# Patient Record
Sex: Female | Born: 2000 | Race: Black or African American | Hispanic: No | Marital: Single | Smoking: Current some day smoker
Health system: Southern US, Community
[De-identification: ages and names within clinical notes are randomized; demographics above are authoritative.]

## PROBLEM LIST (undated history)

## (undated) DIAGNOSIS — N946 Dysmenorrhea, unspecified: Secondary | ICD-10-CM

## (undated) DIAGNOSIS — Z9109 Other allergy status, other than to drugs and biological substances: Secondary | ICD-10-CM

## (undated) HISTORY — PX: TONSILLECTOMY: SUR1361

## (undated) HISTORY — PX: OTHER SURGICAL HISTORY: SHX169

## (undated) HISTORY — DX: Dysmenorrhea, unspecified: N94.6

---
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
## 2019-02-13 ENCOUNTER — Ambulatory Visit: Payer: Medicaid Other | Attending: Internal Medicine

## 2019-02-14 ENCOUNTER — Ambulatory Visit: Payer: Medicaid Other | Attending: Internal Medicine

## 2019-02-14 DIAGNOSIS — Z20822 Contact with and (suspected) exposure to covid-19: Secondary | ICD-10-CM

## 2019-02-16 LAB — NOVEL CORONAVIRUS, NAA: SARS-CoV-2, NAA: NOT DETECTED

## 2019-06-01 ENCOUNTER — Other Ambulatory Visit: Payer: Self-pay

## 2019-06-01 ENCOUNTER — Encounter (HOSPITAL_COMMUNITY): Payer: Self-pay | Admitting: Emergency Medicine

## 2019-06-01 ENCOUNTER — Emergency Department (HOSPITAL_COMMUNITY)
Admission: EM | Admit: 2019-06-01 | Discharge: 2019-06-02 | Disposition: A | Discharge status: Home / Self Care | Payer: Medicaid Other | Attending: Emergency Medicine | Admitting: Emergency Medicine

## 2019-06-01 DIAGNOSIS — Z79899 Other long term (current) drug therapy: Secondary | ICD-10-CM | POA: Insufficient documentation

## 2019-06-01 DIAGNOSIS — L02411 Cutaneous abscess of right axilla: Secondary | ICD-10-CM | POA: Diagnosis not present

## 2019-06-01 DIAGNOSIS — Z7722 Contact with and (suspected) exposure to environmental tobacco smoke (acute) (chronic): Secondary | ICD-10-CM | POA: Insufficient documentation

## 2019-06-01 DIAGNOSIS — R2231 Localized swelling, mass and lump, right upper limb: Secondary | ICD-10-CM | POA: Diagnosis present

## 2019-06-01 LAB — CBC WITH DIFFERENTIAL/PLATELET
Abs Immature Granulocytes: 0.09 10*3/uL — ABNORMAL HIGH (ref 0.00–0.07)
Basophils Absolute: 0.1 10*3/uL (ref 0.0–0.1)
Basophils Relative: 0 %
Eosinophils Absolute: 0.2 10*3/uL (ref 0.0–0.5)
Eosinophils Relative: 1 %
HCT: 39.1 % (ref 36.0–46.0)
Hemoglobin: 11.8 g/dL — ABNORMAL LOW (ref 12.0–15.0)
Immature Granulocytes: 1 %
Lymphocytes Relative: 22 %
Lymphs Abs: 3.6 10*3/uL (ref 0.7–4.0)
MCH: 21.7 pg — ABNORMAL LOW (ref 26.0–34.0)
MCHC: 30.2 g/dL (ref 30.0–36.0)
MCV: 71.9 fL — ABNORMAL LOW (ref 80.0–100.0)
Monocytes Absolute: 0.9 10*3/uL (ref 0.1–1.0)
Monocytes Relative: 5 %
Neutro Abs: 11.6 10*3/uL — ABNORMAL HIGH (ref 1.7–7.7)
Neutrophils Relative %: 71 %
Platelets: 525 10*3/uL — ABNORMAL HIGH (ref 150–400)
RBC: 5.44 MIL/uL — ABNORMAL HIGH (ref 3.87–5.11)
RDW: 14.4 % (ref 11.5–15.5)
WBC: 16.5 10*3/uL — ABNORMAL HIGH (ref 4.0–10.5)
nRBC: 0 % (ref 0.0–0.2)

## 2019-06-01 LAB — I-STAT BETA HCG BLOOD, ED (MC, WL, AP ONLY): I-stat hCG, quantitative: 30.2 m[IU]/mL — ABNORMAL HIGH (ref <5)

## 2019-06-01 NOTE — ED Triage Notes (Signed)
Patient reports worsening multiple abscesses at right axilla for several days , no fever or drainage .

## 2019-06-02 LAB — BASIC METABOLIC PANEL
Anion gap: 10 (ref 5–15)
BUN: 8 mg/dL (ref 6–20)
CO2: 25 mmol/L (ref 22–32)
Calcium: 9 mg/dL (ref 8.9–10.3)
Chloride: 102 mmol/L (ref 98–111)
Creatinine, Ser: 0.73 mg/dL (ref 0.44–1.00)
GFR calc Af Amer: >60 mL/min (ref >60)
GFR calc non Af Amer: >60 mL/min (ref >60)
Glucose, Bld: 96 mg/dL (ref 70–99)
Potassium: 3.3 mmol/L — ABNORMAL LOW (ref 3.5–5.1)
Sodium: 137 mmol/L (ref 135–145)

## 2019-06-02 MED ORDER — DOXYCYCLINE HYCLATE 100 MG PO CAPS: 100.0000 mg | ORAL_CAPSULE | Freq: Two times a day (BID) | ORAL | 0 refills | Status: AC

## 2019-06-02 MED ORDER — LIDOCAINE-EPINEPHRINE (PF) 2 %-1:200000 IJ SOLN
10.0000 mL | Freq: Once | INTRAMUSCULAR | Status: AC
  Administered 2019-06-02: 10 mL via INTRADERMAL
  Filled 2019-06-02: qty 20

## 2019-06-02 MED ORDER — ACETAMINOPHEN 325 MG PO TABS
650.0000 mg | ORAL_TABLET | Freq: Once | ORAL | Status: AC
  Administered 2019-06-02: 650 mg via ORAL
  Filled 2019-06-02: qty 2

## 2019-06-02 NOTE — Discharge Instructions (Addendum)
I have prescribed a short course of antibiotics to help treat your infection, please take 1 tablet twice a day for the next 7 days.  A referral to [facility] is attached to your chart if you choose to have further management of these recurrent abscesses.

## 2019-06-02 NOTE — ED Provider Notes (Signed)
[facility] EMERGENCY DEPARTMENT Provider Note   CSN: [id number] Arrival date & time: 06/01/19  2154     History Chief Complaint  Patient presents with  . Abscess    Right Axilla    Theresa Mcgrath is a 19 y.o. female.  19 y.o female with a PMH of HS presents to the ED with a chief complaint of right axilla abscess.LMP approximately 2 weeks ago.   The history is provided by the patient.  Abscess Location:  Shoulder/arm Shoulder/arm abscess location:  R axilla Abscess quality: draining, painful and warmth   Red streaking: no   Duration:  1 week Progression:  Worsening Pain details:    Quality:  Pressure   Severity:  Moderate   Duration:  1 week   Timing:  Constant Chronicity:  Recurrent Context: not diabetes, not immunosuppression and not injected drug use   Relieved by:  Nothing Worsened by:  Draining/squeezing Ineffective treatments:  None tried Associated symptoms: fever   Risk factors: prior abscess   Risk factors: no family hx of MRSA and no hx of MRSA        Past Medical History:  Diagnosis Date  . Dysmenorrhea   . Environmental allergies     Patient Active Problem List   Diagnosis Date Noted  . Morbid obesity (HCC) 03/18/2012    Past Surgical History:  Procedure Laterality Date  . TONSILLECTOMY       OB History   No obstetric history on file.     Family History  Problem Relation Age of Onset  . Hypertension Paternal Grandmother   . Hypertension Paternal Grandfather     Social History   Tobacco Use  . Smoking status: Passive Smoke Exposure - Never Smoker  . Smokeless tobacco: Never Used  Substance Use Topics  . Alcohol use: No  . Drug use: No    Home Medications Prior to Admission medications   Medication Sig Start Date End Date Taking? Authorizing Provider  doxycycline (MONODOX) 100 MG capsule Take 100 mg by mouth 2 (two) times daily.   Yes [provider]  doxycycline (VIBRAMYCIN) 100 MG capsule Take 1  capsule (100 mg total) by mouth 2 (two) times daily for 7 days. 06/02/19 06/09/19  Claude Manges, PA-C    Allergies    Patient has no known allergies.  Review of Systems   Review of Systems  Constitutional: Positive for fever.  Skin: Positive for wound.    Physical Exam Updated Vital Signs BP 126/78   Pulse (!) 108   Temp 98.7 F (37.1 C) (Oral)   Resp 18   Ht 5\' 7"  (1.702 m)   Wt 130 kg   LMP 05/18/2019   SpO2 100%   BMI 44.89 kg/m   Physical Exam Vitals and nursing note reviewed.  Constitutional:      Appearance: Normal appearance. She is not ill-appearing.  HENT:     Head: Normocephalic and atraumatic.     Mouth/Throat:     Mouth: Mucous membranes are moist.  Cardiovascular:     Rate and Rhythm: Tachycardia present.     Comments: Febrile. Pulmonary:     Effort: Pulmonary effort is normal.  Abdominal:     General: Abdomen is flat.  Musculoskeletal:     Cervical back: Normal range of motion and neck supple.  Skin:    General: Skin is warm and dry.     Findings: Abscess and erythema present.     Comments: 1 cm indurated, fluctuant, actively  draining abscess under right axilla.  Neurological:     Mental Status: She is alert and oriented to person, place, and time.     ED Results / Procedures / Treatments   Labs (all labs ordered are listed, but only abnormal results are displayed) Labs Reviewed  CBC WITH DIFFERENTIAL/PLATELET - Abnormal; Notable for the following components:      Result Value   WBC 16.5 (*)    RBC 5.44 (*)    Hemoglobin 11.8 (*)    MCV 71.9 (*)    MCH 21.7 (*)    Platelets 525 (*)    Neutro Abs 11.6 (*)    Abs Immature Granulocytes 0.09 (*)    All other components within normal limits  BASIC METABOLIC PANEL - Abnormal; Notable for the following components:   Potassium 3.3 (*)    All other components within normal limits  I-STAT BETA HCG BLOOD, ED ([facility], [facility], [facility] ONLY) - Abnormal; Notable for the following components:   I-stat hCG,  quantitative 30.2 (*)    All other components within normal limits    EKG None  Radiology No results found.  Procedures .Marland KitchenIncision and Drainage  Date/Time: 06/02/2019 8:03 AM Performed by: Claude Manges, PA-C Authorized by: Claude Manges, PA-C   Consent:    Consent obtained:  Verbal   Consent given by:  Patient   Risks discussed:  Bleeding, incomplete drainage, pain and damage to other organs   Alternatives discussed:  No treatment Universal protocol:    Procedure explained and questions answered to patient or proxy's satisfaction: yes     Relevant documents present and verified: yes     Test results available and properly labeled: yes     Imaging studies available: yes     Required blood products, implants, devices, and special equipment available: yes     Site/side marked: yes     Immediately prior to procedure a time out was called: yes     Patient identity confirmed:  Verbally with patient Location:    Type:  Abscess   Size:  1cm   Location:  Upper extremity   Upper extremity location:  Arm   Arm location:  R upper arm Pre-procedure details:    Skin preparation:  Betadine Anesthesia (see MAR for exact dosages):    Anesthesia method:  None Procedure type:    Complexity:  Simple Procedure details:    Incision types:  Single straight   Incision depth:  Subcutaneous   Wound management:  Probed and deloculated, irrigated with saline and extensive cleaning   Drainage:  Purulent and bloody   Drainage amount:  Moderate   Packing materials:  None Post-procedure details:    Patient tolerance of procedure:  Tolerated well, no immediate complications Comments:     Abscess was deloculated without stab incision per patient's request.    (including critical care time)  Medications Ordered in ED Medications  acetaminophen (TYLENOL) tablet 650 mg (650 mg Oral Given 06/02/19 0701)  lidocaine-EPINEPHrine (XYLOCAINE W/EPI) 2 %-1:200000 (PF) injection 10 mL (10 mLs Intradermal  Given 06/02/19 5701)    ED Course  I have reviewed the triage vital signs and the nursing notes.  Pertinent labs & imaging results that were available during my care of the patient were reviewed by me and considered in my medical decision making (see chart for details).  Clinical Course as of Jun 02 854  Fri Jun 02, 2019  0740 LMP 2 weeks ago.   I-stat hCG, quantitative(!): 30.2 [JS]  Clinical Course User Index [JS] Janeece Fitting, PA-C   MDM Rules/Calculators/A&P     Patient with a recurring history of at bedtime presents to the ED with right axilla abscess for the past week.  Has also been running fevers at home,  In the ED with a T-max of 101.2, tachycardia present.  Abscess is actively draining with patient is in the room, discussed I&D in order to further the loculated abscess.  She is agreeable of treatment.  According to her chart which I extensively review, she has had this I indeed in the past.  Interpretation of her labs by me revealed a CBC remarkable for a leukocytosis of 16.5, BMP with some mild hypokalemia, normal creatinine function.  Beta hCG is 30, however her last menstrual period was approximately 2 weeks ago.  She has no other complaints.  I have personally do loculated abscess, extensive amount of purulent discharge from it, irrigated and cleaned along with dressed.  No stab incision was made per patient's request as abscess was actively draining at the time of procedure.  Vitals were rechecked after receiving Tylenol for fever.  She will go home on a short course of doxy to help with symptomatic treatment.  8:56 AM BP 126/78   Pulse (!) 108   Temp 98.7 F (37.1 C) (Oral)   Resp 18   Ht 5\' 7"  (1.702 m)   Wt 130 kg   LMP 05/18/2019   SpO2 100%   BMI 44.89 kg/m   Patient is stable for discharge.  Portions of this note were generated with [employer]. Dictation errors may occur despite best attempts at proofreading.  Final Clinical  Impression(s) / ED Diagnoses Final diagnoses:  Abscess of axilla, right    Rx / DC Orders ED Discharge Orders         Ordered    doxycycline (VIBRAMYCIN) 100 MG capsule  2 times daily     06/02/19 0806           Janeece Fitting, PA-C 06/02/19 0857    Orpah Greek, MD 06/03/19 [phone]

## 2020-08-27 ENCOUNTER — Encounter (HOSPITAL_COMMUNITY): Payer: Self-pay

## 2020-08-27 ENCOUNTER — Ambulatory Visit (HOSPITAL_COMMUNITY)
Admission: EM | Admit: 2020-08-27 | Discharge: 2020-08-27 | Disposition: A | Discharge status: Home / Self Care | Payer: Medicaid Other | Attending: Physician Assistant | Admitting: Physician Assistant

## 2020-08-27 DIAGNOSIS — L02411 Cutaneous abscess of right axilla: Secondary | ICD-10-CM

## 2020-08-27 DIAGNOSIS — L03111 Cellulitis of right axilla: Secondary | ICD-10-CM | POA: Diagnosis not present

## 2020-08-27 MED ORDER — DOXYCYCLINE HYCLATE 100 MG PO CAPS: 100.0000 mg | ORAL_CAPSULE | Freq: Two times a day (BID) | ORAL | 0 refills | Status: DC

## 2020-08-27 MED ORDER — LIDOCAINE-EPINEPHRINE 1 %-1:100000 IJ SOLN
INTRAMUSCULAR | Status: AC
  Filled 2020-08-27: qty 1

## 2020-08-27 NOTE — ED Provider Notes (Signed)
[facility]    CSN: [id number] Arrival date & time: 08/27/20  9563      History   Chief Complaint Chief Complaint  Patient presents with   Abscess    HPI Theresa Mcgrath is a 20 y.o. female.   Patient presents today with a several day history of enlarging abscess in her right axilla.  She has a history of hidradenitis suppurativa and often gets recurrent abscesses.  Reports she has had to have them drained in the past and believes this will need to be drained as he continues to become larger and more painful.  Reports pain is rated 10 on a 0-10 pain scale, localized to affected area, described as throbbing, worse with palpation, no relieving factors identified.  She denies any recent antibiotic use.  She has no concern for pregnancy.  She denies any fever, nausea, vomiting, body aches.  She is not currently followed by dermatology.   Past Medical History:  Diagnosis Date   Dysmenorrhea    Environmental allergies     Patient Active Problem List   Diagnosis Date Noted   Morbid obesity (HCC) 03/18/2012    Past Surgical History:  Procedure Laterality Date   TONSILLECTOMY      OB History   No obstetric history on file.      Home Medications    Prior to Admission medications   Medication Sig Start Date End Date Taking? Authorizing Provider  doxycycline (VIBRAMYCIN) 100 MG capsule Take 1 capsule (100 mg total) by mouth 2 (two) times daily. 08/27/20  Yes Draven Natter, Noberto Retort, PA-C    Family History Family History  Problem Relation Age of Onset   Hypertension Paternal Grandmother    Hypertension Paternal Grandfather     Social History Social History   Tobacco Use   Smoking status: Passive Smoke Exposure - Never Smoker   Smokeless tobacco: Never  Substance Use Topics   Alcohol use: No   Drug use: No     Allergies   Patient has no known allergies.   Review of Systems Review of Systems  Constitutional:  Positive for activity change. Negative for appetite  change, fatigue and fever.  Respiratory:  Negative for cough and shortness of breath.   Gastrointestinal:  Negative for abdominal pain, diarrhea, nausea and vomiting.  Musculoskeletal:  Negative for arthralgias and myalgias.  Skin:  Positive for color change and wound.  Neurological:  Negative for dizziness, light-headedness and headaches.    Physical Exam Triage Vital Signs ED Triage Vitals  Enc Vitals Group     BP 08/27/20 1944 137/73     Pulse Rate 08/27/20 1944 (!) 124     Resp 08/27/20 1944 20     Temp 08/27/20 1944 99.9 F (37.7 C)     Temp Source 08/27/20 1944 Oral     SpO2 08/27/20 1944 100 %     Weight --      Height --      Head Circumference --      Peak Flow --      Pain Score 08/27/20 1951 5     Pain Loc --      Pain Edu? --      Excl. in GC? --    No data found.  Updated Vital Signs BP (!) 146/84 (BP Location: Right Arm)   Pulse (!) 106   Temp 99.9 F (37.7 C) (Oral)   Resp 20   SpO2 100%   Visual Acuity Right Eye Distance:  Left Eye Distance:   Bilateral Distance:    Right Eye Near:   Left Eye Near:    Bilateral Near:     Physical Exam Vitals reviewed.  Constitutional:      General: She is awake. She is not in acute distress.    Appearance: Normal appearance. She is not ill-appearing.     Comments: Very pleasant female appears stated age in no acute distress sitting comfortably on exam room table  HENT:     Head: Normocephalic and atraumatic.  Cardiovascular:     Rate and Rhythm: Normal rate and regular rhythm.     Heart sounds: Normal heart sounds, S1 normal and S2 normal. No murmur heard. Pulmonary:     Effort: Pulmonary effort is normal.     Breath sounds: Normal breath sounds. No wheezing, rhonchi or rales.     Comments: Clear to auscultation bilaterally Abdominal:     Palpations: Abdomen is soft.     Tenderness: There is no abdominal tenderness.  Skin:    Findings: Abscess present.     Comments: 7 cm x 5 cm abscess with central  fluctuance noted right axilla.  No streaking or evidence of lymphangitis.  Area is tender to palpation and warm to touch.  No active bleeding or drainage.  Psychiatric:        Behavior: Behavior is cooperative.       UC Treatments / Results  Labs (all labs ordered are listed, but only abnormal results are displayed) Labs Reviewed - No data to display  EKG   Radiology No results found.  Procedures Incision and Drainage  Date/Time: 08/27/2020 8:27 PM Performed by: Jeani Hawking, PA-C Authorized by: Jeani Hawking, PA-C   Consent:    Consent obtained:  Verbal   Consent given by:  Patient   Risks, benefits, and alternatives were discussed: yes     Risks discussed:  Bleeding, incomplete drainage, infection and pain   Alternatives discussed:  Delayed treatment, alternative treatment, observation and referral Universal protocol:    Procedure explained and questions answered to patient or proxy's satisfaction: yes     Patient identity confirmed:  Verbally with patient Location:    Type:  Abscess   Size:  7 cm x 5 cm   Location:  Upper extremity   Upper extremity location:  Shoulder   Shoulder location:  R shoulder Pre-procedure details:    Skin preparation:  Chlorhexidine Sedation:    Sedation type:  None Anesthesia:    Anesthesia method:  Local infiltration   Local anesthetic:  Lidocaine 2% WITH epi Procedure type:    Complexity:  Simple Procedure details:    Ultrasound guidance: no     Needle aspiration: no     Incision types:  Single straight   Incision depth:  Dermal   Wound management:  Probed and deloculated and irrigated with saline   Drainage:  Bloody and purulent   Drainage amount:  Copious   Wound treatment:  Wound left open   Packing materials:  None Post-procedure details:    Procedure completion:  Tolerated well, no immediate complications (including critical care time)  Medications Ordered in UC Medications - No data to display  Initial  Impression / Assessment and Plan / UC Course  I have reviewed the triage vital signs and the nursing notes.  Pertinent labs & imaging results that were available during my care of the patient were reviewed by me and considered in my medical decision making (see chart for  details).      I&D performed in clinic with significant improvement of pain.  See procedure note above.  Patient started on doxycycline 100 mg twice daily for 10 days with instruction to avoid prolonged sun exposure due to positivity associate with this medication.  Discussed alarm symptoms that warrant emergent evaluation.  She can use over-the-counter analgesics for pain relief.  Strict return precautions given.  Patient expressed understanding.  Discussed potential need to see dermatology as symptoms will likely continue to recur unless HS is treated.  Final Clinical Impressions(s) / UC Diagnoses   Final diagnoses:  Abscess of axilla, right  Cellulitis of right axilla     Discharge Instructions      Do doxycycline 100 mg twice daily for 10 days.  This can make you sensitive to the sun so do not be in the sun for long periods of time on this medication.  Keep area clean.  It is importantly follow-up with dermatology as symptoms will likely recur.  If you have any worsening symptoms including recurrence of pain, redness, swelling, fever you need to be seen immediately.  Please follow-up with your primary care provider in 1 to 2 days to ensure symptom improvement.  If you are unable to get in with them in this timeframe please come back to see [place].     ED Prescriptions     Medication Sig Dispense Auth. Provider   doxycycline (VIBRAMYCIN) 100 MG capsule Take 1 capsule (100 mg total) by mouth 2 (two) times daily. 20 capsule Anjelo Pullman, Noberto Retort, PA-C      PDMP not reviewed this encounter.   Jeani Hawking, PA-C 08/27/20 2029

## 2020-08-27 NOTE — ED Triage Notes (Signed)
Pt presents with abscess under right arm X 3 days.

## 2020-08-27 NOTE — Discharge Instructions (Addendum)
Do doxycycline 100 mg twice daily for 10 days.  This can make you sensitive to the sun so do not be in the sun for long periods of time on this medication.  Keep area clean.  It is importantly follow-up with dermatology as symptoms will likely recur.  If you have any worsening symptoms including recurrence of pain, redness, swelling, fever you need to be seen immediately.  Please follow-up with your primary care provider in 1 to 2 days to ensure symptom improvement.  If you are unable to get in with them in this timeframe please come back to see [place].

## 2021-03-20 ENCOUNTER — Telehealth: Payer: Medicaid Other | Admitting: Physician Assistant

## 2021-03-20 DIAGNOSIS — H1033 Unspecified acute conjunctivitis, bilateral: Secondary | ICD-10-CM | POA: Diagnosis not present

## 2021-03-21 MED ORDER — POLYMYXIN B-TRIMETHOPRIM 10000-0.1 UNIT/ML-% OP SOLN: OPHTHALMIC | 0 refills | Status: DC

## 2021-03-21 NOTE — Progress Notes (Signed)
I have spent 5 minutes in review of e-visit questionnaire, review and updating patient chart, medical decision making and response to patient.   Sloka Volante Cody Musab Wingard, PA-C    

## 2021-03-21 NOTE — Progress Notes (Signed)

## 2021-03-21 NOTE — Progress Notes (Signed)
Message sent to patient requesting further input regarding current symptoms. Awaiting patient response.  

## 2021-04-15 ENCOUNTER — Telehealth: Payer: Medicaid Other | Admitting: Family Medicine

## 2021-04-15 ENCOUNTER — Encounter: Payer: Self-pay | Admitting: Family Medicine

## 2021-04-15 DIAGNOSIS — H10022 Other mucopurulent conjunctivitis, left eye: Secondary | ICD-10-CM | POA: Diagnosis not present

## 2021-04-15 MED ORDER — POLYMYXIN B-TRIMETHOPRIM 10000-0.1 UNIT/ML-% OP SOLN: OPHTHALMIC | 0 refills | Status: DC

## 2021-04-15 NOTE — Progress Notes (Signed)

## 2021-08-21 ENCOUNTER — Telehealth: Payer: Medicaid Other | Admitting: Physician Assistant

## 2021-08-21 DIAGNOSIS — L732 Hidradenitis suppurativa: Secondary | ICD-10-CM

## 2021-08-21 MED ORDER — DOXYCYCLINE HYCLATE 100 MG PO TABS: 100.0000 mg | ORAL_TABLET | Freq: Two times a day (BID) | ORAL | 0 refills | Status: AC

## 2021-08-21 NOTE — Progress Notes (Signed)
E Visit for Cellulitis  We are sorry that you are not feeling well. Here is how we plan to help!  Based on what you shared with me it looks like you have chronic HS. I am happy to give a short course of Doxycycline to calm down inflammation and any current infection in the areas, but for ongoing management you will need to see a primary care provider so that they can work with you ongoing to get this better managed which can include Dermatology referral as well. If you have a PCP, please give them a call to schedule a follow-up. If not, you can use the link below to help get scheduled with a new primary provider.    I have prescribed:  Doxycycline 100 mg twice daily for 10 days.    MAKE SURE YOU   Understand these instructions. Will watch your condition. Will get help right away if you are not doing well or get worse.  Thank you for choosing an e-visit.  Your e-visit answers were reviewed by a board certified advanced clinical practitioner to complete your personal care plan. Depending upon the condition, your plan could have included both over the counter or prescription medications.  Please review your pharmacy choice. Make sure the pharmacy is open so you can pick up prescription now. If there is a problem, you may contact your provider through [employer] and have the prescription routed to another pharmacy.  Your safety is important to [place]. If you have drug allergies check your prescription carefully.   For the next 24 hours you can use MyChart to ask questions about today's visit, request a non-urgent call back, or ask for a work or school excuse. You will get an email in the next two days asking about your experience. I hope that your e-visit has been valuable and will speed your recovery.

## 2021-08-27 NOTE — Progress Notes (Signed)
I have spent 5 minutes in review of e-visit questionnaire, review and updating patient chart, medical decision making and response to patient.   Shamiya Demeritt Cody Diquan Kassis, PA-C    

## 2021-10-06 ENCOUNTER — Telehealth: Payer: Medicaid Other | Admitting: Physician Assistant

## 2021-10-06 DIAGNOSIS — K529 Noninfective gastroenteritis and colitis, unspecified: Secondary | ICD-10-CM

## 2021-10-06 NOTE — Progress Notes (Signed)
I have spent 5 minutes in review of e-visit questionnaire, review and updating patient chart, medical decision making and response to patient.   Gladie Gravette Cody Ezriel Boffa, PA-C    

## 2021-10-06 NOTE — Progress Notes (Signed)
We are sorry that you are not feeling well.  Here is how we plan to help!  Based on what you have shared with me it looks like you have Acute Infectious Diarrhea.  Most cases of acute diarrhea are due to infections with virus and bacteria and are self-limited conditions lasting less than 14 days.  For your symptoms you may take Imodium 2 mg tablets that are over the counter at your local pharmacy. Take two tablet now and then one after each loose stool up to 6 a day.  Antibiotics are not needed for most people with diarrhea.  I have prescribed Zofran 4 mg 1 tablet every 8 hours as needed for nausea and vomiting  I will send a work note to your MyChart shortly that will cover today and to include tomorrow if needed. We are not allowed to retroactively date notes unfortunately.   HOME CARE We recommend changing your diet to help with your symptoms for the next few days. Drink plenty of fluids that contain water salt and sugar. Sports drinks such as Gatorade may help.  You may try broths, soups, bananas, applesauce, soft breads, mashed potatoes or crackers.  You are considered infectious for as long as the diarrhea continues. Hand washing or use of alcohol based hand sanitizers is recommend. It is best to stay out of work or school until your symptoms stop.   GET HELP RIGHT AWAY If you have dark yellow colored urine or do not pass urine frequently you should drink more fluids.   If your symptoms worsen  If you feel like you are going to pass out (faint) You have a new problem  MAKE SURE YOU  Understand these instructions. Will watch your condition. Will get help right away if you are not doing well or get worse.  Thank you for choosing an e-visit.  Your e-visit answers were reviewed by a board certified advanced clinical practitioner to complete your personal care plan. Depending upon the condition, your plan could have included both over the counter or prescription  medications.  Please review your pharmacy choice. Make sure the pharmacy is open so you can pick up prescription now. If there is a problem, you may contact your provider through [employer] and have the prescription routed to another pharmacy.  Your safety is important to [place]. If you have drug allergies check your prescription carefully.   For the next 24 hours you can use MyChart to ask questions about today's visit, request a non-urgent call back, or ask for a work or school excuse. You will get an email in the next two days asking about your experience. I hope that your e-visit has been valuable and will speed your recovery.

## 2021-11-07 ENCOUNTER — Telehealth: Payer: Medicaid Other | Admitting: Emergency Medicine

## 2021-11-07 DIAGNOSIS — J069 Acute upper respiratory infection, unspecified: Secondary | ICD-10-CM

## 2021-11-07 MED ORDER — BENZONATATE 100 MG PO CAPS: 100.0000 mg | ORAL_CAPSULE | Freq: Two times a day (BID) | ORAL | 0 refills | Status: DC | PRN

## 2021-11-07 MED ORDER — AZELASTINE HCL 0.1 % NA SOLN: 2.0000 | Freq: Two times a day (BID) | NASAL | 0 refills | Status: DC

## 2021-11-07 NOTE — Progress Notes (Signed)
E-Visit for Upper Respiratory Infection   We are sorry you are not feeling well.  Here is how we plan to help!  Based on what you have shared with me, it looks like you may have a viral upper respiratory infection.  Upper respiratory infections are caused by a large number of viruses; however, rhinovirus is the most common cause.   It is not likely that you have strep throat. If you are worried that you might have strep, you should get tested at a clinic such as an urgent care.    Symptoms of an upper respiratory infection vary from person to person, with common symptoms including sore throat, cough, fatigue or lack of energy and feeling of general discomfort.  A low-grade fever of up to 100.4 may present, but is often uncommon.  Symptoms vary however, and are closely related to a person's age or underlying illnesses.  The most common symptoms associated with an upper respiratory infection are nasal discharge or congestion, cough, sneezing, headache and pressure in the ears and face.  These symptoms usually persist for about 3 to 10 days, but can last up to 2 weeks.  It is important to know that upper respiratory infections do not cause serious illness or complications in most cases.    Upper respiratory infections can be transmitted from person to person, with the most common method of transmission being a person's hands.  The virus is able to live on the skin and can infect other persons for up to 2 hours after direct contact.  Also, these can be transmitted when someone coughs or sneezes; thus, it is important to cover the mouth to reduce this risk.  To keep the spread of the illness at [place], good hand hygiene is very important.  This is an infection that is most likely caused by a virus. There are no specific treatments other than to help you with the symptoms until the infection runs its course.  We are sorry you are not feeling well.  Here is how we plan to help!   For nasal congestion, you may  use an oral decongestants such as Mucinex D or if you have glaucoma or high blood pressure use plain Mucinex.    Saline nasal spray or nasal drops can help and can safely be used as often as needed for congestion.  Try using saline irrigation, such as with a neti pot, several times a day while you are sick. Many neti pots come with salt packets premeasured to use to make saline. If you use your own salt, make sure it is kosher salt or sea salt (don't use table salt as it has iodine in it and you don't need that in your nose). Use distilled water to make saline. If you mix your own saline using your own salt, the recipe is 1/4 teaspoon salt in 1 cup warm water. Using saline irrigation can help prevent and treat sinus infections.   For your congestion, I have prescribed Azelastine nasal spray two sprays in each nostril twice a day  If you do not have a history of heart disease, hypertension, diabetes or thyroid disease, prostate/bladder issues or glaucoma, you may also use Sudafed to treat nasal congestion.  It is highly recommended that you consult with a pharmacist or your primary care physician to ensure this medication is safe for you to take.     If you have a cough, you may use cough suppressants such as Delsym and Robitussin.  If  you have glaucoma or high blood pressure, you can also use Coricidin HBP.   For cough I have prescribed for you A prescription cough medication called Tessalon Perles 100 mg. You may take 1-2 capsules every 8 hours as needed for cough  If you have a sore or scratchy throat, use a saltwater gargle-  to  teaspoon of salt dissolved in a 4-ounce to 8-ounce glass of warm water.  Gargle the solution for approximately 15-30 seconds and then spit.  It is important not to swallow the solution.  You can also use throat lozenges/cough drops and Chloraseptic spray to help with throat pain or discomfort.  Warm or cold liquids can also be helpful in relieving throat pain.  Hot herbal  tea, such as chamomile, with honey and lemon juice also can help soothe a sore throat like yours. The lemon juice in particular helps the pain.   For headache, pain or general discomfort, you can use Ibuprofen or Tylenol as directed.   Some authorities believe that zinc sprays or the use of Echinacea may shorten the course of your symptoms.   HOME CARE Only take medications as instructed by your medical team. Be sure to drink plenty of fluids. Water is fine as well as fruit juices, sodas and electrolyte beverages. You may want to stay away from caffeine or alcohol. If you are nauseated, try taking small sips of liquids. How do you know if you are getting enough fluid? Your urine should be a pale yellow or almost colorless. Get rest. Taking a steamy shower or using a humidifier may help nasal congestion and ease sore throat pain. You can place a towel over your head and breathe in the steam from hot water coming from a faucet. Using a saline nasal spray works much the same way. Cough drops, hard candies and sore throat lozenges may ease your cough. Avoid close contacts especially the very young and the elderly Cover your mouth if you cough or sneeze Always remember to wash your hands.   GET HELP RIGHT AWAY IF: You develop worsening fever. If your symptoms do not improve within 10 days You develop yellow or green discharge from your nose over 3 days. You have coughing fits You develop a severe head ache or visual changes. You develop shortness of breath, difficulty breathing or start having chest pain Your symptoms persist after you have completed your treatment plan  MAKE SURE YOU  Understand these instructions. Will watch your condition. Will get help right away if you are not doing well or get worse.  Thank you for choosing an e-visit.  Your e-visit answers were reviewed by a board certified advanced clinical practitioner to complete your personal care plan. Depending upon the  condition, your plan could have included both over the counter or prescription medications.  Please review your pharmacy choice. Make sure the pharmacy is open so you can pick up prescription now. If there is a problem, you may contact your provider through [employer] and have the prescription routed to another pharmacy.  Your safety is important to [place]. If you have drug allergies check your prescription carefully.   For the next 24 hours you can use MyChart to ask questions about today's visit, request a non-urgent call back, or ask for a work or school excuse. You will get an email in the next two days asking about your experience. I hope that your e-visit has been valuable and will speed your recovery.   I have spent  5 minutes in review of e-visit questionnaire, review and updating patient chart, medical decision making and response to patient.   Willeen Cass, PhD, FNP-BC

## 2021-11-10 ENCOUNTER — Telehealth: Payer: Medicaid Other | Admitting: Physician Assistant

## 2021-11-10 ENCOUNTER — Telehealth: Payer: Medicaid Other | Admitting: Family Medicine

## 2021-11-10 DIAGNOSIS — U071 COVID-19: Secondary | ICD-10-CM

## 2021-11-10 DIAGNOSIS — R0602 Shortness of breath: Secondary | ICD-10-CM

## 2021-11-10 NOTE — Progress Notes (Signed)
Because you are COVID + and noting frequent shortness of breath, you need to be evaluated in person for examination and assessment of oxygen levels.   NOTE: There will be NO CHARGE for this eVisit   If you are having a true medical emergency please call 911.      For an urgent face to face visit, [facility] has seven urgent care centers for your convenience:     [facility] [place] Get Driving Directions [phone] [place], [place] [address]    [facility]) Get Driving Directions [phone] [place], [place] [address]  [facility] [place] [facility]) Get Driving Directions [phone] [address] [place],  [place]  [address]  [facility] at [address] Get Driving Directions  [phone],  [place] [address]   [facility] Get Driving Directions [phone] [address], [place], [place] [address]   [facility] Get Driving Directions  [phone] [address] [place], [place] [address]   [facility] [place] Get Driving Directions [phone] [address] [place], [place] [address]  Your MyChart E-visit questionnaire answers were reviewed by a board certified advanced clinical practitioner to complete your personal care plan based on your specific symptoms.  Thank you for using e-Visits.

## 2021-11-10 NOTE — Progress Notes (Signed)
[place]   Needs to be seen in person to have O2 levels assessed as she is having shortness of breath on exertion, not her normal. Is + for COVID.  Patient acknowledged agreement and understanding of the plan.

## 2021-11-10 NOTE — Progress Notes (Signed)
Duplicate evisit for same complaint after patient informed she needed an in-person evaluation. Has now changed symptoms, removing frequent shortness of breath.

## 2022-08-20 ENCOUNTER — Encounter (HOSPITAL_COMMUNITY): Payer: Self-pay

## 2022-08-20 ENCOUNTER — Ambulatory Visit (HOSPITAL_COMMUNITY)
Admission: EM | Admit: 2022-08-20 | Discharge: 2022-08-20 | Disposition: A | Discharge status: Home / Self Care | Payer: Medicaid Other | Attending: Emergency Medicine | Admitting: Emergency Medicine

## 2022-08-20 DIAGNOSIS — R509 Fever, unspecified: Secondary | ICD-10-CM

## 2022-08-20 DIAGNOSIS — J029 Acute pharyngitis, unspecified: Secondary | ICD-10-CM | POA: Diagnosis present

## 2022-08-20 LAB — POCT RAPID STREP A (OFFICE): Rapid Strep A Screen: NEGATIVE

## 2022-08-20 MED ORDER — IBUPROFEN 800 MG PO TABS: 800.0000 mg | ORAL_TABLET | Freq: Three times a day (TID) | ORAL | 0 refills | Status: DC

## 2022-08-20 NOTE — ED Triage Notes (Signed)
Patient c/o sore throat and fever x 2 days.  Patient states she has taken Tylenol and cough drops. Patient did not have any Tylenol today.

## 2022-08-20 NOTE — Discharge Instructions (Signed)
You likely have a virus which is treated with symptomatic care Ibuprofen alternated with tylenol every 4-6 hours. Make sure you are drinking lots of fluids! Salt water gargles, lozenges, or throat spray  We will call you if anything returns on your throat culture (2-3 days).

## 2022-08-20 NOTE — ED Provider Notes (Signed)
[facility]    CSN: [id number] Arrival date & time: 08/20/22  1710      History   Chief Complaint Chief Complaint  Patient presents with   Fever    HPI Theresa Mcgrath is a 22 y.o. female.  Sore throat x 2 days, currently rated 9/10 Fever yesterday, tmax 102. Took tylenol No meds taken today. No fever today either.  No cough, rash, abd pain, NVD No sick contacts   Hx tonsillectomy   Past Medical History:  Diagnosis Date   Dysmenorrhea    Environmental allergies     Patient Active Problem List   Diagnosis Date Noted   Morbid obesity (HCC) 03/18/2012    Past Surgical History:  Procedure Laterality Date   TONSILLECTOMY     wisdom tooth surgery      OB History   No obstetric history on file.      Home Medications    Prior to Admission medications   Medication Sig Start Date End Date Taking? Authorizing Provider  ibuprofen (ADVIL) 800 MG tablet Take 1 tablet (800 mg total) by mouth 3 (three) times daily. 08/20/22  Yes Erykah Lippert, Lurena Joiner, PA-C    Family History Family History  Problem Relation Age of Onset   Healthy Mother    Healthy Father    Hypertension Paternal Grandmother    Hypertension Paternal Grandfather     Social History Social History   Tobacco Use   Smoking status: Passive Smoke Exposure - Never Smoker   Smokeless tobacco: Never  Vaping Use   Vaping status: Never Used  Substance Use Topics   Alcohol use: No   Drug use: No     Allergies   Latex   Review of Systems Review of Systems Per HPI  Physical Exam Triage Vital Signs ED Triage Vitals  Encounter Vitals Group     BP 08/20/22 1842 129/83     Systolic BP Percentile --      Diastolic BP Percentile --      Pulse Rate 08/20/22 1842 91     Resp 08/20/22 1842 16     Temp 08/20/22 1842 99.5 F (37.5 C)     Temp Source 08/20/22 1842 Oral     SpO2 08/20/22 1842 98 %     Weight --      Height --      Head Circumference --      Peak Flow --      Pain Score  08/20/22 1844 9     Pain Loc --      Pain Education --      Exclude from Growth Chart --    No data found.  Updated Vital Signs BP 129/83 (BP Location: Left Arm)   Pulse 91   Temp 99.5 F (37.5 C) (Oral)   Resp 16   LMP 07/23/2022   SpO2 98%    Physical Exam Vitals and nursing note reviewed.  Constitutional:      General: She is not in acute distress.    Appearance: She is not ill-appearing.     Comments: Normal phonation, tolerating secretions   HENT:     Right Ear: Tympanic membrane and ear canal normal.     Left Ear: Tympanic membrane and ear canal normal.     Nose: No congestion or rhinorrhea.     Mouth/Throat:     Mouth: Mucous membranes are moist.     Pharynx: Oropharynx is clear. Posterior oropharyngeal erythema (very mild) present.  Comments: No tonsils. Mild erythema posterior oropharynx. No exudate, normal uvula  Eyes:     Conjunctiva/sclera: Conjunctivae normal.  Cardiovascular:     Rate and Rhythm: Normal rate and regular rhythm.     Heart sounds: Normal heart sounds.  Pulmonary:     Effort: Pulmonary effort is normal.     Breath sounds: Normal breath sounds.  Musculoskeletal:     Cervical back: Normal range of motion.  Lymphadenopathy:     Cervical: No cervical adenopathy.  Skin:    General: Skin is warm and dry.  Neurological:     Mental Status: She is alert and oriented to person, place, and time.     UC Treatments / Results  Labs (all labs ordered are listed, but only abnormal results are displayed) Labs Reviewed  CULTURE, GROUP A STREP [facility])  POCT RAPID STREP A (OFFICE)    EKG  Radiology No results found.  Procedures Procedures (including critical care time)  Medications Ordered in UC Medications - No data to display  Initial Impression / Assessment and Plan / UC Course  I have reviewed the triage vital signs and the nursing notes.  Pertinent labs & imaging results that were available during my care of the patient were  reviewed by me and considered in my medical decision making (see chart for details).  Rapid strep negative.  Low concern for strep given exam and history tonsillectomy but will culture to confirm. Discussed likely viral etiology and symptomatic care at home.  Recommend alternating ibuprofen and Tylenol, salt water gargles, lozenges, throat sprays, etc.  Since she is on day 2 may have several more days of symptoms.  Work note provided.  Patient without questions at this time, agrees to plan  Final Clinical Impressions(s) / UC Diagnoses   Final diagnoses:  Sore throat  Subjective fever     Discharge Instructions      You likely have a virus which is treated with symptomatic care Ibuprofen alternated with tylenol every 4-6 hours. Make sure you are drinking lots of fluids! Salt water gargles, lozenges, or throat spray  We will call you if anything returns on your throat culture (2-3 days).    ED Prescriptions     Medication Sig Dispense Auth. Provider   ibuprofen (ADVIL) 800 MG tablet Take 1 tablet (800 mg total) by mouth 3 (three) times daily. 21 tablet Laddie Naeem, Lurena Joiner, PA-C      PDMP not reviewed this encounter.   Kathrine Haddock 08/20/22 8295

## 2022-09-19 ENCOUNTER — Ambulatory Visit: Payer: Medicaid Other

## 2022-10-23 ENCOUNTER — Ambulatory Visit: Payer: Medicaid Other | Admitting: Family Medicine
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
## 2023-01-08 ENCOUNTER — Ambulatory Visit: Payer: Medicaid Other | Admitting: Physician Assistant

## 2023-01-08 ENCOUNTER — Encounter: Payer: Self-pay | Admitting: Physician Assistant

## 2023-01-08 ENCOUNTER — Other Ambulatory Visit (HOSPITAL_COMMUNITY)
Admission: RE | Admit: 2023-01-08 | Discharge: 2023-01-08 | Disposition: A | Discharge status: Home / Self Care | Payer: Medicaid Other | Source: Ambulatory Visit | Attending: Physician Assistant | Admitting: Physician Assistant

## 2023-01-08 VITALS — BP 120/80 | HR 88 | Temp 98.2°F | Resp 18 | Ht 67.0 in | Wt 278.8 lb

## 2023-01-08 DIAGNOSIS — Z113 Encounter for screening for infections with a predominantly sexual mode of transmission: Secondary | ICD-10-CM | POA: Insufficient documentation

## 2023-01-08 DIAGNOSIS — F419 Anxiety disorder, unspecified: Secondary | ICD-10-CM | POA: Insufficient documentation

## 2023-01-08 DIAGNOSIS — Z720 Tobacco use: Secondary | ICD-10-CM | POA: Insufficient documentation

## 2023-01-08 DIAGNOSIS — F32A Depression, unspecified: Secondary | ICD-10-CM

## 2023-01-08 DIAGNOSIS — Z23 Encounter for immunization: Secondary | ICD-10-CM

## 2023-01-08 MED ORDER — ESCITALOPRAM OXALATE 10 MG PO TABS: 10.0000 mg | ORAL_TABLET | Freq: Every day | ORAL | 1 refills | Status: AC

## 2023-01-08 NOTE — Assessment & Plan Note (Signed)
Discussed starting Lexapro (escitalopram) for symptom management. Explained Lexapro may take 4-6 weeks to show effects; common side effects include nausea and fatigue, usually subside within a week. If side effects are intolerable, medication can be stopped after a few days without harm. - Initiate Lexapro 10 mg daily, to be taken at night with food - Encourage patient to make appt with the therapist she is considering  - Provide documentation for work leave covering the two weeks of absence in November

## 2023-01-12 ENCOUNTER — Other Ambulatory Visit: Payer: Self-pay | Admitting: Family

## 2023-01-12 ENCOUNTER — Encounter: Payer: Self-pay | Admitting: Family

## 2023-01-12 LAB — CERVICOVAGINAL ANCILLARY ONLY
Bacterial Vaginitis (gardnerella): POSITIVE — AB
Candida Glabrata: NEGATIVE
Candida Vaginitis: NEGATIVE
Chlamydia: NEGATIVE
Comment: NEGATIVE
Comment: NEGATIVE
Comment: NEGATIVE
Comment: NEGATIVE
Comment: NEGATIVE
Comment: NORMAL
Neisseria Gonorrhea: NEGATIVE
Trichomonas: NEGATIVE

## 2023-01-12 MED ORDER — METRONIDAZOLE 500 MG PO TABS: 500.0000 mg | ORAL_TABLET | Freq: Two times a day (BID) | ORAL | 0 refills | Status: DC

## 2023-01-18 DIAGNOSIS — Z0279 Encounter for issue of other medical certificate: Secondary | ICD-10-CM

## 2023-02-12 ENCOUNTER — Ambulatory Visit: Payer: Medicaid Other | Admitting: Physician Assistant

## 2023-02-12 ENCOUNTER — Inpatient Hospital Stay: Payer: Medicaid Other | Admitting: Physician Assistant

## 2023-02-12 VITALS — BP 136/85 | HR 90 | Temp 98.6°F | Resp 18 | Ht 67.0 in | Wt 286.4 lb

## 2023-02-12 DIAGNOSIS — F32A Depression, unspecified: Secondary | ICD-10-CM | POA: Diagnosis not present

## 2023-02-12 DIAGNOSIS — B9689 Other specified bacterial agents as the cause of diseases classified elsewhere: Secondary | ICD-10-CM

## 2023-02-12 DIAGNOSIS — N760 Acute vaginitis: Secondary | ICD-10-CM | POA: Diagnosis not present

## 2023-02-12 DIAGNOSIS — F419 Anxiety disorder, unspecified: Secondary | ICD-10-CM

## 2023-02-12 MED ORDER — BUPROPION HCL ER (XL) 150 MG PO TB24: 150.0000 mg | ORAL_TABLET | Freq: Every day | ORAL | 1 refills | Status: AC

## 2023-02-12 MED ORDER — METRONIDAZOLE 500 MG PO TABS: 500.0000 mg | ORAL_TABLET | Freq: Two times a day (BID) | ORAL | 0 refills | Status: DC

## 2023-02-12 NOTE — Progress Notes (Signed)
Established patient visit   Patient: Theresa Mcgrath   DOB: [birth year]   23 y.o. Female  MRN: [id number] Visit Date: 02/12/2023  Today's healthcare provider: Alfredia Ferguson, PA-C   Cc. Anxiety/depresssion f/u  Subjective     Anxiety, Follow-up  She was last seen for anxiety and depression around a month ago. It was recommended to start lexapro 10 mg daily, and to make an appt with the therapist she was considering.  She was cleared for the two weeks she was off of work 2/2 anxiety/depression.  She reports excellent compliance with treatment. She reports excellent tolerance of treatment. She is not having side effects.   She feels her anxiety is moderate and Improved since last visit.  Depression, Follow-up  She feels she is Unchanged since last visit.     02/12/2023    9:45 AM  Depression screen PHQ 2/9  Decreased Interest 3  Down, Depressed, Hopeless 2  PHQ - 2 Score 5  Altered sleeping 2  Tired, decreased energy 2  Change in appetite 1  Feeling bad or failure about yourself  1  Trouble concentrating 3  Moving slowly or fidgety/restless 3  Suicidal thoughts 0  PHQ-9 Score 17  Difficult doing work/chores Somewhat difficult    GAD-7 Results    02/12/2023    9:46 AM  GAD-7 Generalized Anxiety Disorder Screening Tool  1. Feeling Nervous, Anxious, or on Edge 3  2. Not Being Able to Stop or Control Worrying 2  3. Worrying Too Much About Different Things 2  4. Trouble Relaxing 3  5. Being So Restless it's Hard To Sit Still 1  6. Becoming Easily Annoyed or Irritable 2  7. Feeling Afraid As If Something Awful Might Happen 0  Total GAD-7 Score 13  Difficulty At Work, Home, or Getting  Along With Others? Very difficult   Discussed the use of AI scribe software for clinical note transcription with the patient, who gave verbal consent to proceed.  History of Present Illness   The patient reports some improvement in their anxiety symptoms since starting Lexapro.  They note a decrease in their nervousness, but still experience periods of low mood and lack of motivation, particularly in the past week. They have missed a few days of work due to needing a break. The patient also reports being tearful, which has not changed significantly. They have not yet started therapy, citing nervousness about discussing their issues as a barrier.   In addition, the patient has a positive swab for BV, but has not yet started the prescribed antibiotic, metronidazole.          Medications: Outpatient Medications Prior to Visit  Medication Sig   escitalopram (LEXAPRO) 10 MG tablet Take 1 tablet (10 mg total) by mouth daily.   [DISCONTINUED] metroNIDAZOLE (FLAGYL) 500 MG tablet Take 1 tablet (500 mg total) by mouth 2 (two) times daily.   No facility-administered medications prior to visit.    Review of Systems  Constitutional:  Negative for fatigue and fever.  Respiratory:  Negative for cough and shortness of breath.   Cardiovascular:  Negative for chest pain and leg swelling.  Gastrointestinal:  Negative for abdominal pain.  Neurological:  Negative for dizziness and headaches.  Psychiatric/Behavioral:  Positive for decreased concentration. The patient is nervous/anxious.        Objective    BP 136/85 (BP Location: Left Arm, Patient Position: Sitting, Cuff Size: Large)   Pulse 90   Temp 98.6 F (37  C) (Oral)   Resp 18   Ht 5\' 7"  (1.702 m)   Wt 286 lb 6.4 oz (129.9 kg)   LMP 01/25/2023   SpO2 100%   BMI 44.86 kg/m    Physical Exam Vitals reviewed.  Constitutional:      Appearance: She is not ill-appearing.  HENT:     Head: Normocephalic.  Eyes:     Conjunctiva/sclera: Conjunctivae normal.  Cardiovascular:     Rate and Rhythm: Normal rate.  Pulmonary:     Effort: Pulmonary effort is normal. No respiratory distress.  Neurological:     General: No focal deficit present.     Mental Status: She is alert and oriented to person, place, and time.   Psychiatric:        Mood and Affect: Mood is anxious and depressed. Affect is tearful.        Behavior: Behavior normal.      No results found for any visits on 02/12/23.  Assessment & Plan    Anxiety and depression Assessment & Plan: - Continue Lexapro 10 mg daily - Add Wellbutrin 150 xr in the morning, advised no alcohol consumption - Encourage scheduling a therapy appointment  Orders: -     buPROPion HCl ER (XL); Take 1 tablet (150 mg total) by mouth daily.  Dispense: 90 tablet; Refill: 1  BV (bacterial vaginosis) -     metroNIDAZOLE; Take 1 tablet (500 mg total) by mouth 2 (two) times daily.  Dispense: 14 tablet; Refill: 0   Bacterial Vaginosis (BV) Did not pick up previously prescribed metronidazole.  - Send prescription for metronidazole - Advise to avoid alcohol while taking metronidazole  Follow-up - Schedule follow-up appointment in one month - Complete and submit FMLA paperwork for intermittent leave, covering four days a month from December 9 for six months.       Alfredia Ferguson, PA-C  [facility] at [facility] [phone] (phone) [phone] (fax)  [facility]

## 2023-02-12 NOTE — Assessment & Plan Note (Signed)
-   Continue Lexapro 10 mg daily - Add Wellbutrin 150 xr in the morning, advised no alcohol consumption - Encourage scheduling a therapy appointment

## 2023-02-24 ENCOUNTER — Ambulatory Visit (HOSPITAL_COMMUNITY)
Admission: EM | Admit: 2023-02-24 | Discharge: 2023-02-25 | Disposition: A | Discharge status: Home / Self Care | Payer: Medicaid Other | Attending: Psychiatry | Admitting: Psychiatry

## 2023-02-24 DIAGNOSIS — R4589 Other symptoms and signs involving emotional state: Secondary | ICD-10-CM

## 2023-02-24 DIAGNOSIS — Z79899 Other long term (current) drug therapy: Secondary | ICD-10-CM | POA: Diagnosis not present

## 2023-02-24 DIAGNOSIS — F39 Unspecified mood [affective] disorder: Secondary | ICD-10-CM

## 2023-02-24 DIAGNOSIS — F339 Major depressive disorder, recurrent, unspecified: Secondary | ICD-10-CM | POA: Insufficient documentation

## 2023-02-24 DIAGNOSIS — F419 Anxiety disorder, unspecified: Secondary | ICD-10-CM | POA: Diagnosis not present

## 2023-02-24 DIAGNOSIS — Z5986 Financial insecurity: Secondary | ICD-10-CM | POA: Insufficient documentation

## 2023-02-24 MED ORDER — OLANZAPINE 10 MG IM SOLR: 10.0000 mg | Freq: Three times a day (TID) | INTRAMUSCULAR | Status: DC | PRN

## 2023-02-24 MED ORDER — MAGNESIUM HYDROXIDE 400 MG/5ML PO SUSP: 30.0000 mL | Freq: Every day | ORAL | Status: DC | PRN

## 2023-02-24 MED ORDER — OLANZAPINE 5 MG PO TBDP: 5.0000 mg | ORAL_TABLET | Freq: Three times a day (TID) | ORAL | Status: DC | PRN

## 2023-02-24 MED ORDER — OLANZAPINE 10 MG IM SOLR: 5.0000 mg | Freq: Three times a day (TID) | INTRAMUSCULAR | Status: DC | PRN

## 2023-02-24 MED ORDER — ACETAMINOPHEN 325 MG PO TABS: 650.0000 mg | ORAL_TABLET | Freq: Four times a day (QID) | ORAL | Status: DC | PRN

## 2023-02-24 MED ORDER — ALUM & MAG HYDROXIDE-SIMETH 200-200-20 MG/5ML PO SUSP: 30.0000 mL | ORAL | Status: DC | PRN

## 2023-02-24 NOTE — ED Provider Notes (Signed)
 [facility] Continuous Assessment Admission H&P  Date: 02/25/23 Patient Name: Theresa Mcgrath MRN: [id number] Chief Complaint: increase depression and anxiety   Diagnoses:  Final diagnoses:  Recurrent major depressive disorder, remission status unspecified (HCC)  Episodic mood disorder (HCC)  Ineffective coping  Anxious appearance    HPI: Theresa Mcgrath, 22y/o female with history of MDD and Anxiety presented to [facility] voluntarily accompanied by her friend.  Per the patient she has been having increased depression and anxiety has been going on for a while and lately she has been very tearful.  According to the patient her anxiety and depression is coming from her financial stress that she is having where she was at 1 point taking care of all the bills.  Patient also stated she is having difficulty leaving her house or going anywhere apart from going to work and that the only thing but she has no interest in doing things.  Patient report she is currently not seeing a psychiatrist or therapist but is prescribed Wellbutrin  and Lexapro  by her PCP.  Patient denies any prior hospitalization for any psychiatric problems.  Face-to-face evaluation of patient, patient is alert and oriented x 4, speech is clear, maintaining eye contact.  Patient can be very tearful at times, patient mood is very depressed her affect is flat congruent with mood.  However patient answer questions when asked appropriately.  Patient denies current SI but reports that she had thoughts in the past but not recently.  Patient denies HI, AVH or paranoia.  Patient denies access to guns at this time.  Patient denies wanting to hurt herself.  Or others.  At this time patient does not seem to be influenced by internal stimuli.  However patient does appear to be very depressed and seem to be having a hard time coping.  Given patient presentation.  Writer discussed with patient the need for admissions.  Patient in agreement with  plan.  Recommend inpatient observation.    Total Time spent with patient: 30 minutes  Musculoskeletal  Strength & Muscle Tone: within normal limits Gait & Station: normal Patient leans: N/A  Psychiatric Specialty Exam  Presentation General Appearance:  Casual  Eye Contact: Good  Speech: Clear and Coherent  Speech Volume: Normal  Handedness: Right   Mood and Affect  Mood: Depressed; Anxious; Hopeless  Affect: Depressed; Tearful   Thought Process  Thought Processes: Coherent  Descriptions of Associations:Intact  Orientation:Full (Time, Place and Person)  Thought Content:Logical    Hallucinations:Hallucinations: None  Ideas of Reference:None  Suicidal Thoughts:Suicidal Thoughts: No  Homicidal Thoughts:Homicidal Thoughts: No   Sensorium  Memory: Immediate Good  Judgment: Good  Insight: Good   Executive Functions  Concentration: Fair  Attention Span: Good  Recall: Good  Fund of Knowledge: Good  Language: Good   Psychomotor Activity  Psychomotor Activity: Psychomotor Activity: Normal   Assets  Assets: Desire for Improvement; Social Support   Sleep  Sleep: Sleep: Fair Number of Hours of Sleep: 6   Nutritional Assessment (For OBS and FBC admissions only) Has the patient had a weight loss or gain of 10 pounds or more in the last 3 months?: No Has the patient had a decrease in food intake/or appetite?: No Does the patient have dental problems?: No Does the patient have eating habits or behaviors that may be indicators of an eating disorder including binging or inducing vomiting?: No Has the patient recently lost weight without trying?: 0 Has the patient been eating poorly because of a decreased appetite?:  0 Malnutrition Screening Tool Score: 0    Physical Exam HENT:     Head: Normocephalic.     Nose: Nose normal.  Eyes:     Pupils: Pupils are equal, round, and reactive to light.  Cardiovascular:     Rate and  Rhythm: Normal rate.  Pulmonary:     Effort: Pulmonary effort is normal.  Musculoskeletal:        General: Normal range of motion.     Cervical back: Normal range of motion.  Neurological:     General: No focal deficit present.     Mental Status: She is alert.  Psychiatric:        Mood and Affect: Mood normal.        Thought Content: Thought content normal.    Review of Systems  Constitutional: Negative.   HENT: Negative.    Eyes: Negative.   Respiratory: Negative.    Cardiovascular: Negative.   Gastrointestinal: Negative.   Genitourinary: Negative.   Musculoskeletal: Negative.   Skin: Negative.   Neurological: Negative.   Psychiatric/Behavioral:  Positive for depression. The patient is nervous/anxious.     Last menstrual period 01/25/2023. There is no height or weight on file to calculate BMI.  Past Psychiatric History: General anxiety, major depressive major depressive disorder.  Is the patient at risk to self? No  Has the patient been a risk to self in the past 6 months? Yes .    Has the patient been a risk to self within the distant past? No   Is the patient a risk to others? No   Has the patient been a risk to others in the past 6 months? No   Has the patient been a risk to others within the distant past? No   Past Medical History: see chart   Family History: unknown   Social History: marijuana  Last Labs:  Admission on 02/24/2023  Component Date Value Ref Range Status   WBC 02/25/2023 13.1 (H)  4.0 - 10.5 K/uL Final   RBC 02/25/2023 5.36 (H)  3.87 - 5.11 MIL/uL Final   Hemoglobin 02/25/2023 11.7 (L)  12.0 - 15.0 g/dL Final   HCT 97/93/7974 37.9  36.0 - 46.0 % Final   MCV 02/25/2023 70.7 (L)  80.0 - 100.0 fL Final   MCH 02/25/2023 21.8 (L)  26.0 - 34.0 pg Final   MCHC 02/25/2023 30.9  30.0 - 36.0 g/dL Final   RDW 97/93/7974 15.8 (H)  11.5 - 15.5 % Final   Platelets 02/25/2023 432 (H)  150 - 400 K/uL Final   nRBC 02/25/2023 0.0  0.0 - 0.2 % Final    Neutrophils Relative % 02/25/2023 55  % Final   Neutro Abs 02/25/2023 7.3  1.7 - 7.7 K/uL Final   Lymphocytes Relative 02/25/2023 36  % Final   Lymphs Abs 02/25/2023 4.8 (H)  0.7 - 4.0 K/uL Final   Monocytes Relative 02/25/2023 4  % Final   Monocytes Absolute 02/25/2023 0.5  0.1 - 1.0 K/uL Final   Eosinophils Relative 02/25/2023 4  % Final   Eosinophils Absolute 02/25/2023 0.5  0.0 - 0.5 K/uL Final   Basophils Relative 02/25/2023 1  % Final   Basophils Absolute 02/25/2023 0.1  0.0 - 0.1 K/uL Final   Immature Granulocytes 02/25/2023 0  % Final   Abs Immature Granulocytes 02/25/2023 0.04  0.00 - 0.07 K/uL Final   Performed at [facility], [address] [place], [place] [address]   Sodium 02/25/2023  138  135 - 145 mmol/L Final   Potassium 02/25/2023 4.3  3.5 - 5.1 mmol/L Final   Chloride 02/25/2023 104  98 - 111 mmol/L Final   CO2 02/25/2023 23  22 - 32 mmol/L Final   Glucose, Bld 02/25/2023 79  70 - 99 mg/dL Final   Glucose reference range applies only to samples taken after fasting for at least 8 hours.   BUN 02/25/2023 13  6 - 20 mg/dL Final   Creatinine, Ser 02/25/2023 0.62  0.44 - 1.00 mg/dL Final   Calcium 97/93/7974 9.2  8.9 - 10.3 mg/dL Final   Total Protein 97/93/7974 6.5  6.5 - 8.1 g/dL Final   Albumin 97/93/7974 3.4 (L)  3.5 - 5.0 g/dL Final   AST 97/93/7974 18  15 - 41 U/L Final   ALT 02/25/2023 21  0 - 44 U/L Final   Alkaline Phosphatase 02/25/2023 73  38 - 126 U/L Final   Total Bilirubin 02/25/2023 0.2  0.0 - 1.2 mg/dL Final   GFR, Estimated 02/25/2023 >60  >60 mL/min Final   Comment: (NOTE) Calculated using the CKD-EPI Creatinine Equation (2021)    Anion gap 02/25/2023 11  5 - 15 Final   Performed at [facility], [address], [place], [place] [address]   Alcohol, Ethyl (B) 02/25/2023 <10  <10 mg/dL Final   Comment: (NOTE) Lowest detectable limit for serum alcohol is 10 mg/dL.  For medical purposes only. Performed at [facility], [address] [place], [place] [address]    TSH 02/25/2023 3.043  0.350 - 4.500 uIU/mL Final   Comment: Performed by a 3rd Generation assay with a functional sensitivity of <=0.01 uIU/mL. Performed at [facility], [address], [place], [place] [address]    Preg Test, Ur 02/25/2023 Negative  Negative Final   POC Amphetamine UR 02/25/2023 None Detected  NONE DETECTED (Cut Off Level 1000 ng/mL) Final   POC Secobarbital (BAR) 02/25/2023 None Detected  NONE DETECTED (Cut Off Level 300 ng/mL) Final   POC Buprenorphine (BUP) 02/25/2023 None Detected  NONE DETECTED (Cut Off Level 10 ng/mL) Final   POC Oxazepam (BZO) 02/25/2023 None Detected  NONE DETECTED (Cut Off Level 300 ng/mL) Final   POC Cocaine UR 02/25/2023 None Detected  NONE DETECTED (Cut Off Level 300 ng/mL) Final   POC Methamphetamine UR 02/25/2023 None Detected  NONE DETECTED (Cut Off Level 1000 ng/mL) Final   POC Morphine 02/25/2023 None Detected  NONE DETECTED (Cut Off Level 300 ng/mL) Final   POC Methadone UR 02/25/2023 None Detected  NONE DETECTED (Cut Off Level 300 ng/mL) Final   POC Oxycodone UR 02/25/2023 None Detected  NONE DETECTED (Cut Off Level 100 ng/mL) Final   POC Marijuana UR 02/25/2023 Positive (A)  NONE DETECTED (Cut Off Level 50 ng/mL) Final   Free T4 02/25/2023 1.00  0.61 - 1.12 ng/dL Final   Comment: (NOTE) Biotin ingestion may interfere with free T4 tests. If the results are inconsistent with the TSH level, previous test results, or the clinical presentation, then consider biotin interference. If needed, order repeat testing after stopping biotin. Performed at [facility], [address] [place], [place] [address]   Office Visit on 01/08/2023  Component Date Value Ref Range Status   Neisseria Gonorrhea 01/08/2023 Negative   Final   Chlamydia 01/08/2023 Negative   Final   Trichomonas 01/08/2023 Negative   Final   Bacterial Vaginitis (gardnerella) 01/08/2023 Positive (A)   Final  Candida Vaginitis  01/08/2023 Negative   Final   Candida Glabrata 01/08/2023 Negative   Final   Comment 01/08/2023 Normal Reference Range Bacterial Vaginosis - Negative   Final   Comment 01/08/2023 Normal Reference Ranger Chlamydia - Negative   Final   Comment 01/08/2023 Normal Reference Range Neisseria Gonorrhea - Negative   Final   Comment 01/08/2023 Normal Reference Range Candida Species - Negative   Final   Comment 01/08/2023 Normal Reference Range Candida Galbrata - Negative   Final   Comment 01/08/2023 Normal Reference Range Trichomonas - Negative   Final    Allergies: Latex  Medications:  Facility Ordered Medications  Medication   acetaminophen  (TYLENOL ) tablet 650 mg   alum & mag hydroxide-simeth (MAALOX/MYLANTA) 200-200-20 MG/5ML suspension 30 mL   magnesium  hydroxide (MILK OF MAGNESIA) suspension 30 mL   OLANZapine  zydis (ZYPREXA ) disintegrating tablet 5 mg   OLANZapine  (ZYPREXA ) injection 5 mg   OLANZapine  (ZYPREXA ) injection 10 mg   PTA Medications  Medication Sig   escitalopram  (LEXAPRO ) 10 MG tablet Take 1 tablet (10 mg total) by mouth daily.   metroNIDAZOLE  (FLAGYL ) 500 MG tablet Take 1 tablet (500 mg total) by mouth 2 (two) times daily.   buPROPion  (WELLBUTRIN  XL) 150 MG 24 hr tablet Take 1 tablet (150 mg total) by mouth daily.      Medical Decision Making  Inpatient observation  Lab Orders         SARS Coronavirus 2 by RT PCR (hospital order, performed in [facility] hospital lab) *cepheid single result test* Anterior Nasal Swab         CBC with Differential/Platelet         Comprehensive metabolic panel         Ethanol         TSH         T4, free         POC urine preg, ED         POCT Urine Drug Screen - (I-Screen)      Meds ordered this encounter  Medications   acetaminophen  (TYLENOL ) tablet 650 mg   alum & mag hydroxide-simeth (MAALOX/MYLANTA) 200-200-20 MG/5ML suspension 30 mL   magnesium  hydroxide (MILK OF MAGNESIA) suspension 30 mL   OLANZapine  zydis (ZYPREXA )  disintegrating tablet 5 mg   OLANZapine  (ZYPREXA ) injection 5 mg   OLANZapine  (ZYPREXA ) injection 10 mg       Recommendations  Based on my evaluation the patient does not appear to have an emergency medical condition.  Gaither Pouch, NP 02/25/23  5:17 AM

## 2023-02-25 ENCOUNTER — Telehealth (HOSPITAL_COMMUNITY): Payer: Self-pay | Admitting: Professional

## 2023-02-25 LAB — POCT URINE DRUG SCREEN - MANUAL ENTRY (I-SCREEN)
POC Amphetamine UR: NOT DETECTED
POC Buprenorphine (BUP): NOT DETECTED
POC Cocaine UR: NOT DETECTED
POC Marijuana UR: POSITIVE — AB
POC Methadone UR: NOT DETECTED
POC Methamphetamine UR: NOT DETECTED
POC Morphine: NOT DETECTED
POC Oxazepam (BZO): NOT DETECTED
POC Oxycodone UR: NOT DETECTED
POC Secobarbital (BAR): NOT DETECTED

## 2023-02-25 LAB — CBC WITH DIFFERENTIAL/PLATELET
Abs Immature Granulocytes: 0.04 10*3/uL (ref 0.00–0.07)
Basophils Absolute: 0.1 10*3/uL (ref 0.0–0.1)
Basophils Relative: 1 %
Eosinophils Absolute: 0.5 10*3/uL (ref 0.0–0.5)
Eosinophils Relative: 4 %
HCT: 37.9 % (ref 36.0–46.0)
Hemoglobin: 11.7 g/dL — ABNORMAL LOW (ref 12.0–15.0)
Immature Granulocytes: 0 %
Lymphocytes Relative: 36 %
Lymphs Abs: 4.8 10*3/uL — ABNORMAL HIGH (ref 0.7–4.0)
MCH: 21.8 pg — ABNORMAL LOW (ref 26.0–34.0)
MCHC: 30.9 g/dL (ref 30.0–36.0)
MCV: 70.7 fL — ABNORMAL LOW (ref 80.0–100.0)
Monocytes Absolute: 0.5 10*3/uL (ref 0.1–1.0)
Monocytes Relative: 4 %
Neutro Abs: 7.3 10*3/uL (ref 1.7–7.7)
Neutrophils Relative %: 55 %
Platelets: 432 10*3/uL — ABNORMAL HIGH (ref 150–400)
RBC: 5.36 MIL/uL — ABNORMAL HIGH (ref 3.87–5.11)
RDW: 15.8 % — ABNORMAL HIGH (ref 11.5–15.5)
WBC: 13.1 10*3/uL — ABNORMAL HIGH (ref 4.0–10.5)
nRBC: 0 % (ref 0.0–0.2)

## 2023-02-25 LAB — COMPREHENSIVE METABOLIC PANEL
ALT: 21 U/L (ref 0–44)
AST: 18 U/L (ref 15–41)
Albumin: 3.4 g/dL — ABNORMAL LOW (ref 3.5–5.0)
Alkaline Phosphatase: 73 U/L (ref 38–126)
Anion gap: 11 (ref 5–15)
BUN: 13 mg/dL (ref 6–20)
CO2: 23 mmol/L (ref 22–32)
Calcium: 9.2 mg/dL (ref 8.9–10.3)
Chloride: 104 mmol/L (ref 98–111)
Creatinine, Ser: 0.62 mg/dL (ref 0.44–1.00)
GFR, Estimated: >60 mL/min (ref >60)
Glucose, Bld: 79 mg/dL (ref 70–99)
Potassium: 4.3 mmol/L (ref 3.5–5.1)
Sodium: 138 mmol/L (ref 135–145)
Total Bilirubin: 0.2 mg/dL (ref 0.0–1.2)
Total Protein: 6.5 g/dL (ref 6.5–8.1)

## 2023-02-25 LAB — ETHANOL: Alcohol, Ethyl (B): <10 mg/dL (ref <10)

## 2023-02-25 LAB — TSH: TSH: 3.043 u[IU]/mL (ref 0.350–4.500)

## 2023-02-25 LAB — T4, FREE: Free T4: 1 ng/dL (ref 0.61–1.12)

## 2023-02-25 LAB — POC URINE PREG, ED: Preg Test, Ur: NEGATIVE

## 2023-02-25 NOTE — ED Notes (Signed)
 Pt is currently sleeping, no distress noted, environmental check complete, will continue to monitor patient for safety.

## 2023-02-25 NOTE — BH Assessment (Signed)
 Comprehensive Clinical Assessment (CCA) Note   02/25/2023 Theresa Mcgrath [id number]  Disposition: Gaither Trudy PIETY recommends continuous observation. Pt will be seen by psychiatry in AM.   The patient demonstrates the following risk factors for suicide: Chronic risk factors for suicide include: psychiatric disorder of major depressive disorder . Acute risk factors for suicide include: social withdrawal/isolation. Protective factors for this patient include: positive social support. Considering these factors, the overall suicide risk at this point appears to be low. Patient is not appropriate for outpatient follow up.     Pt is 23 yo female who presents to [facility] voluntarily accompanied by her fiancee. Pt was tearful throughout triage. Pt reports that she is struggling with anxiety. Pt reports that she often has racing thoughts, no motivation, worries about her fianical concerns. Pt reports that she has difficulty getting out of bed which is causing her to miss work. Pt has missed 6 days of work. Pt denies SI,HI, and AVH. Pt reports that she is on medication for mental health but is currently not seeing a therapist. Pt admits to smoking marijuana daily.  On evaluation, patient is alert, oriented x 3, and cooperative. Pt was tearful. Speech is clear, coherent and logical. Pt appears casual. Eye contact is fair. Mood is anxious and depressed, affect is congruent with mood. Thought process is logical and thought content is coherent. Pt denies SI/HI/AVH. There is no indication that the patient is responding to internal stimuli. No delusions elicited during this assessment.   Chief Complaint:  Chief Complaint  Patient presents with   Anxiety   Depression   Visit Diagnosis:  Major Depressive Disorder     CCA Screening, Triage and Referral (STR)  Patient Reported Information How did you hear about us ? Self What Is the Reason for Your Visit/Call Today? Pt is 23 yo female who presents to [facility]  voluntarily accompanied by her fiancee. Pt was tearful throughout triage. Pt reports that she is struggling with anxiety. Pt reports that she often has racing thoughts, no motivation, worries about her fianical concerns. Pt reports that she has difficulty getting out of bed which is causing her to miss work. Pt has missed 6 days of work. Pt denies SI,HI, and AVH. Pt reports that she is on medication for mental health but is currently not seeing a therapist. Pt admits to smoking marijuana daily.  How Long Has This Been Causing You Problems? > than 6 months  What Do You Feel Would Help You the Most Today? Treatment for Depression or other mood problem; Stress Management   Have You Recently Had Any Thoughts About Hurting Yourself? No  Are You Planning to Commit Suicide/Harm Yourself At This time? No   Flowsheet Row ED from 02/24/2023 in [facility] ED from 08/20/2022 in [facility] ED from [facility]/27/2020 in [facility] at [facility] RISK CATEGORY No Risk No Risk No Risk       Have you Recently Had Thoughts About Hurting Someone Sherral? No  Are You Planning to Harm Someone at This Time? No  Explanation: Denies HI   Have You Used Any Alcohol or Drugs in the Past 24 Hours? Yes  How Long Ago Did You Use Drugs or Alcohol?Today  What Did You Use and How Much? Marijuana / 2 blunts   Do You Currently Have a Therapist/Psychiatrist? No  Name of Therapist/Psychiatrist:    Have You Been Recently Discharged From Any Office Practice or Programs? No  Explanation of Discharge From Practice/Program: n/a    CCA Screening Triage Referral Assessment Type of Contact: Face-to-Face  Telemedicine Service Delivery:   Is this Initial or Reassessment?   Date Telepsych consult ordered in [facility]:    Time Telepsych consult ordered in [facility]:    Location of Assessment: [facility] Assessment Services  Provider Location: [facility] Assessment  Services   Collateral Involvement: none   Does Patient Have a Automotive Engineer Guardian? No  Legal Guardian Contact Information: n/a  Copy of Legal Guardianship Form: -- (n/a)  Legal Guardian Notified of Arrival: -- (n/a)  Legal Guardian Notified of Pending Discharge: -- (n/a)  If Minor and Not Living with Parent(s), Who has Custody? n/a  Is CPS involved or ever been involved? Never  Is APS involved or ever been involved? Never   Patient Determined To Be At Risk for Harm To Self or Others Based on Review of Patient Reported Information or Presenting Complaint? No  Method: No Plan  Availability of Means: No access or NA  Intent: Vague intent or NA  Notification Required: No need or identified person  Additional Information for Danger to Others Potential: -- (n/a)  Additional Comments for Danger to Others Potential: n/a  Are There Guns or Other Weapons in Your Home? No  Types of Guns/Weapons: Denies access to guns/weapons  Are These Weapons Safely Secured?                            No  Who Could Verify You Are Able To Have These Secured: Denies access to guns/weapons  Do You Have any Outstanding Charges, Pending Court Dates, Parole/Probation? Denies pending legal charges  Contacted To Inform of Risk of Harm To Self or Others: -- (n/a)    Does Patient Present under Involuntary Commitment? No    [place] of Residence: [place]   Patient Currently Receiving the Following Services: Medication Management   Determination of Need: Routine (7 days)   Options For Referral: Medication Management; Outpatient Therapy     CCA Biopsychosocial Patient Reported Schizophrenia/Schizoaffective Diagnosis in Past: No   Strengths: Willingness to seek treatment   Mental Health Symptoms Depression:  Hopelessness; Change in energy/activity   Duration of Depressive symptoms: Duration of Depressive Symptoms: Greater than two weeks   Mania:  None   Anxiety:    Worrying; Sleep; Fatigue   Psychosis:  None   Duration of Psychotic symptoms:    Trauma:  None   Obsessions:  None   Compulsions:  None   Inattention:  None   Hyperactivity/Impulsivity:  None   Oppositional/Defiant Behaviors:  None   Emotional Irregularity:  Chronic feelings of emptiness   Other Mood/Personality Symptoms:  none    Mental Status Exam Appearance and self-care  Stature:  Average   Weight:  Average weight   Clothing:  Casual   Grooming:  Normal   Cosmetic use:  None   Posture/gait:  Normal   Motor activity:  Not Remarkable   Sensorium  Attention:  Normal   Concentration:  Normal   Orientation:  X5   Recall/memory:  Normal   Affect and Mood  Affect:  Depressed; Tearful   Mood:  Depressed   Relating  Eye contact:  Normal   Facial expression:  Depressed; Sad   Attitude toward examiner:  Cooperative   Thought and Language  Speech flow: Clear and Coherent   Thought content:  Appropriate to Mood and Circumstances   Preoccupation:  None   Hallucinations:  None   Organization:  Coherent   [employer] of Knowledge:  Fair   Intelligence:  Normal  Abstraction:  Normal   Judgement:  Fair   Brewing Technologist   Insight:  Fair   Decision Making:  Normal   Social Functioning  Social Maturity:  Isolates   Social Judgement:  Normal   Stress  Stressors:  Surveyor, Quantity; Work   Coping Ability:  Normal   Skill Deficits:  Interpersonal   Supports:  Friends/Service system     Religion: Religion/Spirituality Are You A Religious Person?: No How Might This Affect Treatment?: n/a  Leisure/Recreation: Leisure / Recreation Do You Have Hobbies?: No  Exercise/Diet: Exercise/Diet Do You Exercise?: No Have You Gained or Lost A Significant Amount of Weight in the Past Six Months?: No Do You Follow a Special Diet?: No Do You Have Any Trouble Sleeping?: No   CCA Employment/Education Employment/Work  Situation: Employment / Work Situation Employment Situation: Employed Work Stressors: Pt reports that she is struggling with motivation to attend work. Pt has missed 6 days. Patient's Job has Been Impacted by Current Illness: No Has Patient ever Been in the [employer]?: No  Education: Education Is Patient Currently Attending School?: No Last Grade Completed: 12 Did You Attend College?: No Did You Have An Individualized Education Program (IIEP): No Did You Have Any Difficulty At School?: No Patient's Education Has Been Impacted by Current Illness: No   CCA Family/Childhood History Family and Relationship History: Family history Marital status: Single Does patient have children?: No  Childhood History:  Childhood History By whom was/is the patient raised?: Mother Did patient suffer any verbal/emotional/physical/sexual abuse as a child?: No Did patient suffer from severe childhood neglect?: No Has patient ever been sexually abused/assaulted/raped as an adolescent or adult?: No Was the patient ever a victim of a crime or a disaster?: No Witnessed domestic violence?: No Has patient been affected by domestic violence as an adult?: No       CCA Substance Use Alcohol/Drug Use: Alcohol / Drug Use Pain Medications: See MAR Prescriptions: See MAR Over the Counter: See MAR History of alcohol / drug use?: Yes Longest period of sobriety (when/how long): n/a Negative Consequences of Use:  (n/a) Withdrawal Symptoms: None Substance #1 Name of Substance 1: Marijuana 1 - Amount (size/oz): 2 blunts 1 - Frequency: Daily 1 - Last Use / Amount: 02/24/23                       ASAM's:  Six Dimensions of Multidimensional Assessment  Dimension 1:  Acute Intoxication and/or Withdrawal Potential:      Dimension 2:  Biomedical Conditions and Complications:      Dimension 3:  Emotional, Behavioral, or Cognitive Conditions and Complications:     Dimension 4:  Readiness to Change:      Dimension 5:  Relapse, Continued use, or Continued Problem Potential:     Dimension 6:  Recovery/Living Environment:     ASAM Severity Score:    ASAM Recommended Level of Treatment: ASAM Recommended Level of Treatment: Level I Outpatient Treatment   Substance use Disorder (SUD) Substance Use Disorder (SUD)  Checklist Symptoms of Substance Use: Continued use despite persistent or recurrent social, interpersonal problems, caused or exacerbated by use  Recommendations for Services/Supports/Treatments: Recommendations for Services/Supports/Treatments Recommendations For Services/Supports/Treatments: Medication Management, Individual Therapy  Disposition Recommendation per psychiatric provider: Continuous observation   DSM5 Diagnoses: Patient Active Problem List  Diagnosis Date Noted   Anxiety and depression 01/08/2023   Tobacco use 01/08/2023   Morbid obesity (HCC) 03/18/2012     Referrals to Alternative Service(s): Referred to Alternative Service(s):   Place:   Date:   Time:    Referred to Alternative Service(s):   Place:   Date:   Time:    Referred to Alternative Service(s):   Place:   Date:   Time:    Referred to Alternative Service(s):   Place:   Date:   Time:     Rosina PARAS, [place], [facility]

## 2023-02-25 NOTE — Discharge Instructions (Addendum)
 Theresa Mcgrath

## 2023-02-25 NOTE — ED Provider Notes (Addendum)
 FBC/OBS ASAP Discharge Summary  Date and Time: 02/25/2023 8:24 AM  Name: Theresa Mcgrath  MRN:  [id number]   Discharge Diagnoses:  Final diagnoses:  Recurrent major depressive disorder, remission status unspecified (HCC)  Episodic mood disorder (HCC)  Ineffective coping  Anxious appearance    Subjective:  Theresa Mcgrath 23 year old African-American female was seen and evaluated face-to-face after overnight observation.  She presents with a bright and pleasant affect.  Denying any suicidal or homicidal ideations.  Reports she was admitted due to increased anxiety/panic attacks.  She reports she resides with her significant other provided verbal authorization for this provider to follow-up with Joesph Fell.  This provider attempted to contact [facility] at [phone].  Patient appears future goal oriented.  Discussed following up with partial hospitalization programming for additional coping skills/support.  Patient to continue Wellbutrin  and Prozac as indicated.  Support encouragement reassurance was provided.  Stay Summary: per admission assessment note:  Theresa Mcgrath, 22y/o female with history of MDD and Anxiety presented to [facility] voluntarily accompanied by her friend.  Per the patient she has been having increased depression and anxiety has been going on for a while and lately she has been very tearful.  According to the patient her anxiety and depression is coming from her financial stress that she is having where she was at 1 point taking care of all the bills.  Patient also stated she is having difficulty leaving her house or going anywhere apart from going to work and that the only thing but she has no interest in doing things.  Patient report she is currently not seeing a psychiatrist or therapist but is prescribed Wellbutrin  and Lexapro  by her PCP.  Patient denies any prior hospitalization for any psychiatric problems.   Total Time spent with patient: 15 minutes  Past Psychiatric History: see  chart Family History: see chart Family Psychiatric History: see chart Social History: see chart Tobacco Cessation:  N/A, patient does not currently use tobacco products  Current Medications:  Current Facility-Administered Medications  Medication Dose Route Frequency Provider Last Rate Last Admin   acetaminophen  (TYLENOL ) tablet 650 mg  650 mg Oral Q6H PRN Trudy Carwin, NP       alum & mag hydroxide-simeth (MAALOX/MYLANTA) 200-200-20 MG/5ML suspension 30 mL  30 mL Oral Q4H PRN Trudy Carwin, NP       magnesium  hydroxide (MILK OF MAGNESIA) suspension 30 mL  30 mL Oral Daily PRN Trudy Carwin, NP       OLANZapine  (ZYPREXA ) injection 10 mg  10 mg Intramuscular TID PRN Trudy Carwin, NP       OLANZapine  (ZYPREXA ) injection 5 mg  5 mg Intramuscular TID PRN Trudy Carwin, NP       OLANZapine  zydis (ZYPREXA ) disintegrating tablet 5 mg  5 mg Oral TID PRN Trudy Carwin, NP       Current Outpatient Medications  Medication Sig Dispense Refill   buPROPion  (WELLBUTRIN  XL) 150 MG 24 hr tablet Take 1 tablet (150 mg total) by mouth daily. 90 tablet 1   escitalopram  (LEXAPRO ) 10 MG tablet Take 1 tablet (10 mg total) by mouth daily. 90 tablet 1   metroNIDAZOLE  (FLAGYL ) 500 MG tablet Take 1 tablet (500 mg total) by mouth 2 (two) times daily. 14 tablet 0    PTA Medications:  Facility Ordered Medications  Medication   acetaminophen  (TYLENOL ) tablet 650 mg   alum & mag hydroxide-simeth (MAALOX/MYLANTA) 200-200-20 MG/5ML suspension 30 mL   magnesium  hydroxide (MILK OF MAGNESIA) suspension 30 mL  OLANZapine  zydis (ZYPREXA ) disintegrating tablet 5 mg   OLANZapine  (ZYPREXA ) injection 5 mg   OLANZapine  (ZYPREXA ) injection 10 mg   PTA Medications  Medication Sig   escitalopram  (LEXAPRO ) 10 MG tablet Take 1 tablet (10 mg total) by mouth daily.   metroNIDAZOLE  (FLAGYL ) 500 MG tablet Take 1 tablet (500 mg total) by mouth 2 (two) times daily.   buPROPion  (WELLBUTRIN  XL) 150 MG 24 hr tablet Take 1 tablet (150  mg total) by mouth daily.       02/12/2023    9:45 AM  Depression screen PHQ 2/9  Decreased Interest 3  Down, Depressed, Hopeless 2  PHQ - 2 Score 5  Altered sleeping 2  Tired, decreased energy 2  Change in appetite 1  Feeling bad or failure about yourself  1  Trouble concentrating 3  Moving slowly or fidgety/restless 3  Suicidal thoughts 0  PHQ-9 Score 17  Difficult doing work/chores Somewhat difficult    Flowsheet Row ED from 02/24/2023 in [facility] ED from 08/20/2022 in [facility] ED from 08/27/2020 in [facility] RISK CATEGORY No Risk No Risk No Risk       Musculoskeletal  Strength & Muscle Tone: within normal limits Gait & Station: normal Patient leans: N/A  Psychiatric Specialty Exam  Presentation  General Appearance:  Appropriate for Environment  Eye Contact: Good  Speech: Clear and Coherent  Speech Volume: Normal  Handedness: Right   Mood and Affect  Mood: Anxious  Affect: Congruent   Thought Process  Thought Processes: Coherent  Descriptions of Associations:Intact  Orientation:Full (Time, Place and Person)  Thought Content:Logical  Diagnosis of Schizophrenia or Schizoaffective disorder in past: No    Hallucinations:Hallucinations: None  Ideas of Reference:None  Suicidal Thoughts:Suicidal Thoughts: No  Homicidal Thoughts:Homicidal Thoughts: No   Sensorium  Memory: Immediate Good; Recent Good; Remote Good  Judgment: Good  Insight: Good   Executive Functions  Concentration: Fair  Attention Span: Good  Recall: Good  Fund of Knowledge: Good  Language: Good   Psychomotor Activity  Psychomotor Activity: Psychomotor Activity: Normal   Assets  Assets: Desire for Improvement; Social Support   Sleep  Sleep: Sleep: Fair Number of Hours of Sleep: 6   Nutritional Assessment (For OBS and FBC admissions only) Has the  patient had a weight loss or gain of 10 pounds or more in the last 3 months?: No Has the patient had a decrease in food intake/or appetite?: No Does the patient have dental problems?: No Does the patient have eating habits or behaviors that may be indicators of an eating disorder including binging or inducing vomiting?: No Has the patient recently lost weight without trying?: 0 Has the patient been eating poorly because of a decreased appetite?: 0 Malnutrition Screening Tool Score: 0    Physical Exam  Physical Exam Vitals and nursing note reviewed.  Constitutional:      Appearance: Normal appearance.  Cardiovascular:     Rate and Rhythm: Normal rate and regular rhythm.  Neurological:     Mental Status: She is alert and oriented to person, place, and time.  Psychiatric:        Mood and Affect: Mood normal.        Thought Content: Thought content normal.    Review of Systems  Psychiatric/Behavioral:  Negative for depression and suicidal ideas. The patient is nervous/anxious.   All other systems reviewed and are negative.  Blood pressure 128/75,  pulse 88, temperature 98.3 F (36.8 C), temperature source Oral, resp. rate 18, last menstrual period 01/25/2023, SpO2 98%. There is no height or weight on file to calculate BMI.  Demographic Factors:  Female  Loss Factors: Financial problems/change in socioeconomic status  Historical Factors: NA  Risk Reduction Factors:   Positive social support and Positive therapeutic relationship  Continued Clinical Symptoms:  Panic Attacks  Cognitive Features That Contribute To Risk:  Closed-mindedness    Suicide Risk:  Minimal: No identifiable suicidal ideation.  Patients presenting with no risk factors but with morbid ruminations; may be classified as minimal risk based on the severity of the depressive symptoms  Plan Of Care/Follow-up recommendations:  Activity:  as tolerated  Diet:  heart healthy  Disposition: Take all of you  medications as prescribed by your mental healthcare provider.  Report any adverse effects and reactions from your medications to your outpatient provider promptly.  Do not engage in alcohol and or illegal drug use while on prescription medicines. Keep all scheduled appointments. This is to ensure that you are getting refills on time and to avoid any interruption in your medication.  If you are unable to keep an appointment call to reschedule.  Be sure to follow up with resources and follow ups given. In the event of worsening symptoms call the crisis hotline, 911, and or go to the nearest emergency department for appropriate evaluation and treatment of symptoms. Follow-up with your primary care provider for your medical issues, concerns and or health care needs.    Staci LOISE Kerns, NP 02/25/2023, 8:24 AM

## 2023-03-01 ENCOUNTER — Telehealth (HOSPITAL_COMMUNITY): Payer: Self-pay | Admitting: Professional

## 2023-03-03 ENCOUNTER — Telehealth (HOSPITAL_COMMUNITY): Payer: Self-pay | Admitting: Professional

## 2023-03-07 ENCOUNTER — Telehealth: Payer: Medicaid Other | Admitting: Nurse Practitioner

## 2023-03-07 DIAGNOSIS — H109 Unspecified conjunctivitis: Secondary | ICD-10-CM

## 2023-03-07 MED ORDER — POLYMYXIN B-TRIMETHOPRIM 10000-0.1 UNIT/ML-% OP SOLN
1.0000 [drp] | OPHTHALMIC | 0 refills | Status: AC
Start: 2023-03-07 — End: ?

## 2023-03-07 NOTE — Progress Notes (Signed)
 Virtual Visit Consent   [name], you are scheduled for a virtual visit with a [facility] provider today. Just as with appointments in the office, your consent must be obtained to participate. Your consent will be active for this visit and any virtual visit you may have with one of our providers in the next 365 days. If you have a MyChart account, a copy of this consent can be sent to you electronically.  As this is a virtual visit, video technology does not allow for your provider to perform a traditional examination. This may limit your provider's ability to fully assess your condition. If your provider identifies any concerns that need to be evaluated in person or the need to arrange testing (such as labs, EKG, etc.), we will make arrangements to do so. Although advances in technology are sophisticated, we cannot ensure that it will always work on either your end or our end. If the connection with a video visit is poor, the visit may have to be switched to a telephone visit. With either a video or telephone visit, we are not always able to ensure that we have a secure connection.  By engaging in this virtual visit, you consent to the provision of healthcare and authorize for your insurance to be billed (if applicable) for the services provided during this visit. Depending on your insurance coverage, you may receive a charge related to this service.  I need to obtain your verbal consent now. Are you willing to proceed with your visit today? [name] has provided verbal consent on 03/07/2023 for a virtual visit (video or telephone). Claiborne Rigg, NP  Date: 03/07/2023 6:04 PM   Virtual Visit via Video Note   I, Claiborne Rigg, connected with  Briarrose Shor  ([id number], 07/09/2000) on 03/07/23 at  6:00 PM EST by a video-enabled telemedicine application and verified that I am speaking with the correct person using two identifiers.  Location: Patient: Virtual Visit Location Patient:  Home Provider: Virtual Visit Location Provider: Home Office   I discussed the limitations of evaluation and management by telemedicine and the availability of in person appointments. The patient expressed understanding and agreed to proceed.    History of Present Illness: Theresa Mcgrath is a 23 y.o. who identifies as a female who was assigned female at birth, and is being seen today for bilateral bacterial conjunctivitis.  [name] states she woke up this morning with severe redness of both eyes and draining and crusting of the right eye. She denies any other URI symptoms.    Problems:  Patient Active Problem List   Diagnosis Date Noted   Anxiety and depression 01/08/2023   Tobacco use 01/08/2023   Morbid obesity (HCC) 03/18/2012    Allergies:  Allergies  Allergen Reactions   Latex    Medications:  Current Outpatient Medications:    trimethoprim-polymyxin b (POLYTRIM) ophthalmic solution, Place 1 drop into both eyes every 4 (four) hours., Disp: 10 mL, Rfl: 0   buPROPion (WELLBUTRIN XL) 150 MG 24 hr tablet, Take 1 tablet (150 mg total) by mouth daily., Disp: 90 tablet, Rfl: 1   escitalopram (LEXAPRO) 10 MG tablet, Take 1 tablet (10 mg total) by mouth daily., Disp: 90 tablet, Rfl: 1  Observations/Objective: Patient is well-developed, well-nourished in no acute distress.  Resting comfortably at home.  Head is normocephalic, atraumatic.  No labored breathing.  Speech is clear and coherent with logical content.  Patient is alert and oriented at baseline.  Assessment and Plan: 1. Bacterial conjunctivitis (Primary) - trimethoprim-polymyxin b (POLYTRIM) ophthalmic solution; Place 1 drop into both eyes every 4 (four) hours.  Dispense: 10 mL; Refill: 0   Follow Up Instructions: I discussed the assessment and treatment plan with the patient. The patient was provided an opportunity to ask questions and all were answered. The patient agreed with the plan and demonstrated an  understanding of the instructions.  A copy of instructions were sent to the patient via MyChart unless otherwise noted below.    The patient was advised to call back or seek an in-person evaluation if the symptoms worsen or if the condition fails to improve as anticipated.    Claiborne Rigg, NP

## 2023-03-07 NOTE — Patient Instructions (Signed)
  Theresa Mcgrath, thank you for joining Theresa Rigg, Theresa Mcgrath for today's virtual visit.  While this provider is not your primary care provider (PCP), if your PCP is located in our provider database this encounter information will be shared with them immediately following your visit.   A [facility] MyChart account gives you access to today's visit and all your visits, tests, and labs performed at [facility] " click here if you don't have a [facility] MyChart account or go to [email or web address]  Consent: (Patient) Theresa Mcgrath provided verbal consent for this virtual visit at the beginning of the encounter.  Current Medications:  Current Outpatient Medications:    trimethoprim-polymyxin b (POLYTRIM) ophthalmic solution, Place 1 drop into both eyes every 4 (four) hours., Disp: 10 mL, Rfl: 0   buPROPion (WELLBUTRIN XL) 150 MG 24 hr tablet, Take 1 tablet (150 mg total) by mouth daily., Disp: 90 tablet, Rfl: 1   escitalopram (LEXAPRO) 10 MG tablet, Take 1 tablet (10 mg total) by mouth daily., Disp: 90 tablet, Rfl: 1   Medications ordered in this encounter:  Meds ordered this encounter  Medications   trimethoprim-polymyxin b (POLYTRIM) ophthalmic solution    Sig: Place 1 drop into both eyes every 4 (four) hours.    Dispense:  10 mL    Refill:  0    Supervising Provider:   Merrilee Jansky [[id number]]     *If you need refills on other medications prior to your next appointment, please contact your pharmacy*  Follow-Up: Call back or seek an in-person evaluation if the symptoms worsen or if the condition fails to improve as anticipated.  [facility] [phone]  Other Instructions Cold compresses to both eyes for relief of pain and discomfort   If you have been instructed to have an in-person evaluation today at a local Urgent Care facility, please use the link below. It will take you to a list of all of our available [facility], including  address, phone number and hours of operation. Please do not delay care.  [facility]  If you or a family member do not have a primary care provider, use the link below to schedule a visit and establish care. When you choose a [facility] primary care physician or advanced practice provider, you gain a long-term partner in health. Find a Primary Care Provider  Learn more about [facility]'s in-office and virtual care options: [facility] Get Care Now

## 2023-05-01 ENCOUNTER — Telehealth: Admitting: Family

## 2023-05-01 DIAGNOSIS — B3731 Acute candidiasis of vulva and vagina: Secondary | ICD-10-CM | POA: Diagnosis not present

## 2023-05-01 MED ORDER — FLUCONAZOLE 150 MG PO TABS
150.0000 mg | ORAL_TABLET | ORAL | 0 refills | Status: AC | PRN
Start: 2023-05-01 — End: ?

## 2023-05-01 NOTE — Progress Notes (Signed)

## 2023-05-21 ENCOUNTER — Encounter: Admitting: Physician Assistant

## 2023-06-02 ENCOUNTER — Encounter: Admitting: Physician Assistant

## 2023-06-08 NOTE — Progress Notes (Deleted)
 Complete physical exam   Patient: Theresa Mcgrath   DOB: [birth year]   22 y.o. Female  MRN: [id number] Visit Date: 06/09/2023  Today's healthcare provider: Trenton Frock, PA-C   No chief complaint on file.  Subjective    Theresa Mcgrath is a 23 y.o. female who presents today for a complete physical exam.   Pap? Sti?  Past Medical History:  Diagnosis Date   Dysmenorrhea    Environmental allergies    Past Surgical History:  Procedure Laterality Date   TONSILLECTOMY     wisdom tooth surgery     Social History   Socioeconomic History   Marital status: Single    Spouse name: Not on file   Number of children: Not on file   Years of education: Not on file   Highest education level: Not on file  Occupational History   Not on file  Tobacco Use   Smoking status: Some Days    Current packs/day: 0.25    Types: Cigarettes    Passive exposure: Yes   Smokeless tobacco: Never  Vaping Use   Vaping status: Never Used  Substance and Sexual Activity   Alcohol use: No   Drug use: No   Sexual activity: Not on file  Other Topics Concern   Not on file  Social History Narrative   Lives in [place] with dad. Goes to [employer] in 7th grade. Enjoys her language classes. Plays softball.          Social Drivers of Corporate investment banker Strain: Not on file  Food Insecurity: Not on file  Transportation Needs: Not on file  Physical Activity: Not on file  Stress: Not on file  Social Connections: Unknown (06/02/2021)   [facility]   Social Network    Social Network: Not on file  Intimate Partner Violence: Unknown (04/22/2021)   Received from [facility], [facility]   HITS    Physically Hurt: Not on file    Insult or Talk Down To: Not on file    Threaten Physical Harm: Not on file    Scream or Curse: Not on file   Family Status  Relation Name Status   Mother  Alive   Father  Alive   PGM  (Not Specified)   PGF  (Not Specified)   No partnership data on file   Family History  Problem Relation Age of Onset   Healthy Mother    Healthy Father    Hypertension Paternal Grandmother    Hypertension Paternal Grandfather    Allergies  Allergen Reactions   Latex     Patient Care Team: Trenton Frock, PA-C as PCP - General (Physician Assistant)   Medications: Outpatient Medications Prior to Visit  Medication Sig   buPROPion  (WELLBUTRIN  XL) 150 MG 24 hr tablet Take 1 tablet (150 mg total) by mouth daily.   escitalopram  (LEXAPRO ) 10 MG tablet Take 1 tablet (10 mg total) by mouth daily.   fluconazole  (DIFLUCAN ) 150 MG tablet Take 1 tablet (150 mg total) by mouth every three (3) days as needed.   trimethoprim -polymyxin b  (POLYTRIM ) ophthalmic solution Place 1 drop into both eyes every 4 (four) hours.   No facility-administered medications prior to visit.    Review of Systems {Insert previous labs (optional):23779} {See past labs  Heme  Chem  Endocrine  Serology  Results Review (optional):1}  Objective    There were no vitals taken for this visit. {Insert last BP/Wt (optional):23777}{See vitals  history (optional):1}  Physical Exam  ***  Last depression screening scores    02/12/2023    9:45 AM 01/08/2023    3:44 PM  PHQ 2/9 Scores  PHQ - 2 Score 5   PHQ- 9 Score 17   Exception Documentation  Other- indicate reason in comment box  Not completed  pt did not complete   Last fall risk screening    02/12/2023    9:45 AM  Fall Risk   Falls in the past year? 0  Number falls in past yr: 0  Injury with Fall? 0  Risk for fall due to : No Fall Risks  Follow up Falls evaluation completed   Last Audit-C alcohol use screening     No data to display         A score of 3 or more in women, and 4 or more in men indicates increased risk for alcohol abuse, EXCEPT if all of the points are from question 1   No results found for any visits on 06/09/23.  Assessment & Plan    Routine Health Maintenance and  Physical Exam  Exercise Activities and Dietary recommendations  Goals   None    --balanced diet high in fiber and protein, low in sugars, carbs, fats. --physical activity/exercise 20-30 minutes 3-5 times a week    Immunization History  Administered Date(s) Administered   DTaP 02/12/1999, 06/12/1999, 08/13/1999, 07/07/2000, 04/08/2004   HIB (PRP-OMP) 02/12/1999, 04/09/1999, 06/08/1999, 07/07/2000   HPV Quadrivalent 03/18/2012, 03/03/2013, 08/21/2015   Hepatitis A, Ped/Adol-2 Dose 12/26/2008, 11/26/2009   Hepatitis B, PED/ADOLESCENT Nov [birth year], 1998, 02/12/1999, 06/12/1999   Influenza, Seasonal, Injecte, Preservative Fre 01/08/2023   MMR 12/15/1999, 04/08/2004   MenQuadfi_Meningococcal Groups ACYW Conjugate 08/21/2015, 07/06/2017   Meningococcal Conjugate 06/20/2010, 08/21/2015   Pfizer(Comirnaty)Fall Seasonal Vaccine 12 years and older 10/11/2019   Pneumococcal Conjugate PCV 7 04/09/1999, 06/12/1999, 08/13/1999, 12/15/1999   Polio, Unspecified 02/12/1999, 04/09/1999, 12/15/1999, 04/08/2004   Td 06/20/2010, 10/17/2019   Tdap 06/20/2010   Varicella 12/15/1999, 12/26/2008    Health Maintenance  Topic Date Due   HIV Screening  Never done   Hepatitis C Screening  Never done   Pneumococcal Vaccine 70-82 Years old (1 of 2 - PCV) 11/28/2017   Cervical Cancer Screening (Pap smear)  Never done   COVID-19 Vaccine (2 - 2024-25 season) 09/20/2022   INFLUENZA VACCINE  08/20/2023   DTaP/Tdap/Td (9 - Td or Tdap) 10/16/2029   HPV VACCINES  Completed   Meningococcal B Vaccine  Aged Out    Discussed health benefits of physical activity, and encouraged her to engage in regular exercise appropriate for her age and condition.  ***  No follow-ups on file.     Trenton Frock, PA-C  [facility] [phone] (phone) [phone] (fax)  [facility]

## 2023-06-09 ENCOUNTER — Encounter: Admitting: Physician Assistant

## 2023-07-02 ENCOUNTER — Encounter: Admitting: Physician Assistant

## 2023-07-02 NOTE — Progress Notes (Deleted)
 Complete physical exam   Patient: Theresa Mcgrath   DOB: 05/01/2000   22 y.o. Female  MRN: [id number] Visit Date: 07/02/2023  Today's healthcare provider: Trenton Frock, PA-C   No chief complaint on file.  Subjective    Theresa Mcgrath is a 23 y.o. female who presents today for a complete physical exam.   Pap? Sti?  Past Medical History:  Diagnosis Date   Dysmenorrhea    Environmental allergies    Past Surgical History:  Procedure Laterality Date   TONSILLECTOMY     wisdom tooth surgery     Social History   Socioeconomic History   Marital status: Single    Spouse name: Not on file   Number of children: Not on file   Years of education: Not on file   Highest education level: Not on file  Occupational History   Not on file  Tobacco Use   Smoking status: Some Days    Current packs/day: 0.25    Types: Cigarettes    Passive exposure: Yes   Smokeless tobacco: Never  Vaping Use   Vaping status: Never Used  Substance and Sexual Activity   Alcohol use: No   Drug use: No   Sexual activity: Not on file  Other Topics Concern   Not on file  Social History Narrative   Lives in [place] with dad. Goes to [employer] in 7th grade. Enjoys her language classes. Plays softball.          Social Drivers of Corporate investment banker Strain: Not on file  Food Insecurity: Not on file  Transportation Needs: Not on file  Physical Activity: Not on file  Stress: Not on file  Social Connections: Unknown (06/02/2021)   Received from [facility]   Social Network    Social Network: Not on file  Intimate Partner Violence: Unknown (04/22/2021)   Received from [facility]   HITS    Physically Hurt: Not on file    Insult or Talk Down To: Not on file    Threaten Physical Harm: Not on file    Scream or Curse: Not on file   Family Status  Relation Name Status   Mother  Alive   Father  Alive   PGM  (Not Specified)   PGF  (Not Specified)  No partnership data on file    Family History  Problem Relation Age of Onset   Healthy Mother    Healthy Father    Hypertension Paternal Grandmother    Hypertension Paternal Grandfather    Allergies  Allergen Reactions   Latex     Patient Care Team: Trenton Frock, PA-C as PCP - General (Physician Assistant)   Medications: Outpatient Medications Prior to Visit  Medication Sig   buPROPion  (WELLBUTRIN  XL) 150 MG 24 hr tablet Take 1 tablet (150 mg total) by mouth daily.   escitalopram  (LEXAPRO ) 10 MG tablet Take 1 tablet (10 mg total) by mouth daily.   fluconazole  (DIFLUCAN ) 150 MG tablet Take 1 tablet (150 mg total) by mouth every three (3) days as needed.   trimethoprim -polymyxin b  (POLYTRIM ) ophthalmic solution Place 1 drop into both eyes every 4 (four) hours.   No facility-administered medications prior to visit.    Review of Systems {Insert previous labs (optional):23779} {See past labs  Heme  Chem  Endocrine  Serology  Results Review (optional):1}  Objective    There were no vitals taken for this visit. {Insert last BP/Wt (optional):23777}{See vitals history (optional):1}  Physical  Exam  ***  Last depression screening scores    02/12/2023    9:45 AM 01/08/2023    3:44 PM  PHQ 2/9 Scores  PHQ - 2 Score 5   PHQ- 9 Score 17   Exception Documentation  Other- indicate reason in comment box  Not completed  pt did not complete   Last fall risk screening    02/12/2023    9:45 AM  Fall Risk   Falls in the past year? 0  Number falls in past yr: 0  Injury with Fall? 0  Risk for fall due to : No Fall Risks  Follow up Falls evaluation completed   Last Audit-C alcohol use screening     No data to display         A score of 3 or more in women, and 4 or more in men indicates increased risk for alcohol abuse, EXCEPT if all of the points are from question 1   No results found for any visits on 07/02/23.  Assessment & Plan    Routine Health Maintenance and Physical Exam  Exercise  Activities and Dietary recommendations  Goals   None    --balanced diet high in fiber and protein, low in sugars, carbs, fats. --physical activity/exercise 20-30 minutes 3-5 times a week    Immunization History  Administered Date(s) Administered   DTaP 02/12/1999, 06/12/1999, 08/13/1999, 07/07/2000, 04/08/2004   HIB (PRP-OMP) 02/12/1999, 04/09/1999, 06/08/1999, 07/07/2000   HPV Quadrivalent 03/18/2012, 03/03/2013, 08/21/2015   Hepatitis A, Ped/Adol-2 Dose 12/26/2008, 11/26/2009   Hepatitis B, PED/ADOLESCENT 04/11/98, 02/12/1999, 06/12/1999   Influenza, Seasonal, Injecte, Preservative Fre 01/08/2023   MMR 12/15/1999, 04/08/2004   MenQuadfi_Meningococcal Groups ACYW Conjugate 08/21/2015, 07/06/2017   Meningococcal Conjugate 06/20/2010, 08/21/2015   Pfizer(Comirnaty)Fall Seasonal Vaccine 12 years and older 10/11/2019   Pneumococcal Conjugate PCV 7 04/09/1999, 06/12/1999, 08/13/1999, 12/15/1999   Polio, Unspecified 02/12/1999, 04/09/1999, 12/15/1999, 04/08/2004   Td 06/20/2010, 10/17/2019   Tdap 06/20/2010   Varicella 12/15/1999, 12/26/2008    Health Maintenance  Topic Date Due   HIV Screening  Never done   Hepatitis C Screening  Never done   Pneumococcal Vaccine 27-65 Years old (1 of 2 - PCV) 11/28/2017   Cervical Cancer Screening (Pap smear)  Never done   COVID-19 Vaccine (2 - 2024-25 season) 09/20/2022   INFLUENZA VACCINE  08/20/2023   DTaP/Tdap/Td (9 - Td or Tdap) 10/16/2029   HPV VACCINES  Completed   Meningococcal B Vaccine  Aged Out    Discussed health benefits of physical activity, and encouraged her to engage in regular exercise appropriate for her age and condition.  ***  No follow-ups on file.     Trenton Frock, PA-C  [facility] at [facility] [phone] (phone) [phone] (fax)  [facility]

## 2023-08-06 ENCOUNTER — Encounter: Admitting: Physician Assistant

## 2023-08-06 NOTE — Progress Notes (Deleted)
 Complete physical exam   Patient: Theresa Mcgrath   DOB: 2000/07/09   22 y.o. Female  MRN: [id number] Visit Date: 08/06/2023  Today's healthcare provider: Manuelita Flatness, PA-C   No chief complaint on file.  Subjective    Theresa Mcgrath is a 23 y.o. female who presents today for a complete physical exam.   ***  Past Medical History:  Diagnosis Date   Dysmenorrhea    Environmental allergies    Past Surgical History:  Procedure Laterality Date   TONSILLECTOMY     wisdom tooth surgery     Social History   Socioeconomic History   Marital status: Single    Spouse name: Not on file   Number of children: Not on file   Years of education: Not on file   Highest education level: Not on file  Occupational History   Not on file  Tobacco Use   Smoking status: Some Days    Current packs/day: 0.25    Types: Cigarettes    Passive exposure: Yes   Smokeless tobacco: Never  Vaping Use   Vaping status: Never Used  Substance and Sexual Activity   Alcohol use: No   Drug use: No   Sexual activity: Not on file  Other Topics Concern   Not on file  Social History Narrative   Lives in [place] with dad. Goes to [employer] in 7th grade. Enjoys her language classes. Plays softball.          Social Drivers of Corporate investment banker Strain: Not on file  Food Insecurity: Not on file  Transportation Needs: Not on file  Physical Activity: Not on file  Stress: Not on file  Social Connections: Unknown (06/02/2021)   Received from [facility]   Social Network    Social Network: Not on file  Intimate Partner Violence: Unknown (04/22/2021)   Received from [facility]   HITS    Physically Hurt: Not on file    Insult or Talk Down To: Not on file    Threaten Physical Harm: Not on file    Scream or Curse: Not on file   Family Status  Relation Name Status   Mother  Alive   Father  Alive   PGM  (Not Specified)   PGF  (Not Specified)  No partnership data on file    Family History  Problem Relation Age of Onset   Healthy Mother    Healthy Father    Hypertension Paternal Grandmother    Hypertension Paternal Grandfather    Allergies  Allergen Reactions   Latex     Patient Care Team: Flatness Manuelita, PA-C as PCP - General (Physician Assistant)   Medications: Outpatient Medications Prior to Visit  Medication Sig   buPROPion  (WELLBUTRIN  XL) 150 MG 24 hr tablet Take 1 tablet (150 mg total) by mouth daily.   escitalopram  (LEXAPRO ) 10 MG tablet Take 1 tablet (10 mg total) by mouth daily.   fluconazole  (DIFLUCAN ) 150 MG tablet Take 1 tablet (150 mg total) by mouth every three (3) days as needed.   trimethoprim -polymyxin b  (POLYTRIM ) ophthalmic solution Place 1 drop into both eyes every 4 (four) hours.   No facility-administered medications prior to visit.    Review of Systems {Insert previous labs (optional):23779} {See past labs  Heme  Chem  Endocrine  Serology  Results Review (optional):1}  Objective    There were no vitals taken for this visit. {Insert last BP/Wt (optional):23777}{See vitals history (optional):1}  Physical Exam  ***  Last depression screening scores    02/12/2023    9:45 AM 01/08/2023    3:44 PM  PHQ 2/9 Scores  PHQ - 2 Score 5   PHQ- 9 Score 17   Exception Documentation  Other- indicate reason in comment box  Not completed  pt did not complete   Last fall risk screening    02/12/2023    9:45 AM  Fall Risk   Falls in the past year? 0  Number falls in past yr: 0  Injury with Fall? 0  Risk for fall due to : No Fall Risks  Follow up Falls evaluation completed   Last Audit-C alcohol use screening     No data to display         A score of 3 or more in women, and 4 or more in men indicates increased risk for alcohol abuse, EXCEPT if all of the points are from question 1   No results found for any visits on 08/06/23.  Assessment & Plan    Routine Health Maintenance and Physical Exam  Exercise  Activities and Dietary recommendations  Goals   None    --balanced diet high in fiber and protein, low in sugars, carbs, fats. --physical activity/exercise 20-30 minutes 3-5 times a week    Immunization History  Administered Date(s) Administered   DTaP 02/12/1999, 06/12/1999, 08/13/1999, 07/07/2000, 04/08/2004   HIB (PRP-OMP) 02/12/1999, 04/09/1999, 06/08/1999, 07/07/2000   HPV Quadrivalent 03/18/2012, 03/03/2013, 08/21/2015   Hepatitis A, Ped/Adol-2 Dose 12/26/2008, 11/26/2009   Hepatitis B, PED/ADOLESCENT 10-10-1998, 02/12/1999, 06/12/1999   Influenza, Seasonal, Injecte, Preservative Fre 01/08/2023   MMR 12/15/1999, 04/08/2004   MenQuadfi_Meningococcal Groups ACYW Conjugate 08/21/2015, 07/06/2017   Meningococcal Conjugate 06/20/2010, 08/21/2015   Pfizer(Comirnaty)Fall Seasonal Vaccine 12 years and older 10/11/2019   Pneumococcal Conjugate PCV 7 04/09/1999, 06/12/1999, 08/13/1999, 12/15/1999   Polio, Unspecified 02/12/1999, 04/09/1999, 12/15/1999, 04/08/2004   Td 06/20/2010, 10/17/2019   Tdap 06/20/2010   Varicella 12/15/1999, 12/26/2008    Health Maintenance  Topic Date Due   HIV Screening  Never done   Hepatitis C Screening  Never done   Pneumococcal Vaccine 17-34 Years old (1 of 2 - PCV) 11/28/2017   Cervical Cancer Screening (Pap smear)  Never done   COVID-19 Vaccine (2 - 2024-25 season) 09/20/2022   INFLUENZA VACCINE  08/20/2023   DTaP/Tdap/Td (9 - Td or Tdap) 10/16/2029   Hepatitis B Vaccines  Completed   HPV VACCINES  Completed   Meningococcal B Vaccine  Aged Out    Discussed health benefits of physical activity, and encouraged her to engage in regular exercise appropriate for her age and condition.  ***  No follow-ups on file.     Manuelita Flatness, PA-C  [facility] at [facility] [phone] (phone) [phone] (fax)  [facility]

## 2023-09-30 ENCOUNTER — Telehealth: Admitting: Nurse Practitioner

## 2023-09-30 ENCOUNTER — Encounter: Payer: Self-pay | Admitting: Nurse Practitioner

## 2023-09-30 DIAGNOSIS — K089 Disorder of teeth and supporting structures, unspecified: Secondary | ICD-10-CM | POA: Diagnosis not present

## 2023-09-30 MED ORDER — IBUPROFEN 800 MG PO TABS: 800.0000 mg | ORAL_TABLET | Freq: Three times a day (TID) | ORAL | 0 refills | Status: AC | PRN

## 2023-09-30 NOTE — Progress Notes (Signed)
 I have sent ibuprofen  and a letter for work is in Pharmacologist.   E-Visit for Dental Pain  We are sorry that you are not feeling well.  Here is how we plan to help!  Ibuprofen  600mg  3 times a day for 7 days for discomfort  It is imperative that you see a dentist within 10 days of this eVisit to determine the cause of the dental pain and be sure it is adequately treated  A toothache or tooth pain is caused when the nerve in the root of a tooth or surrounding a tooth is irritated. Dental (tooth) infection, decay, injury, or loss of a tooth are the most common causes of dental pain. Pain may also occur after an extraction (tooth is pulled out). Pain sometimes originates from other areas and radiates to the jaw, thus appearing to be tooth pain.Bacteria growing inside your mouth can contribute to gum disease and dental decay, both of which can cause pain. A toothache occurs from inflammation of the central portion of the tooth called pulp. The pulp contains nerve endings that are very sensitive to pain. Inflammation to the pulp or pulpitis may be caused by dental cavities, trauma, and infection.    HOME CARE:   For toothaches: Over-the-counter pain medications such as acetaminophen  or ibuprofen  may be used. Take these as directed on the package while you arrange for a dental appointment. Avoid very cold or hot foods, because they may make the pain worse. You may get relief from biting on a cotton ball soaked in oil of cloves. You can get oil of cloves at most drug stores.  For jaw pain:  Aspirin may be helpful for problems in the joint of the jaw in adults. If pain happens every time you open your mouth widely, the temporomandibular joint (TMJ) may be the source of the pain. Yawning or taking a large bite of food may worsen the pain. An appointment with your doctor or dentist will help you find the cause.     GET HELP RIGHT AWAY IF:  You have a high fever or chills If you have had a recent  head or face injury and develop headache, light headedness, nausea, vomiting, or other symptoms that concern you after an injury to your face or mouth, you could have a more serious injury in addition to your dental injury. A facial rash associated with a toothache: This condition may improve with medication. Contact your doctor for them to decide what is appropriate. Any jaw pain occurring with chest pain: Although jaw pain is most commonly caused by dental disease, it is sometimes referred pain from other areas. People with heart disease, especially people who have had stents placed, people with diabetes, or those who have had heart surgery may have jaw pain as a symptom of heart attack or angina. If your jaw or tooth pain is associated with lightheadedness, sweating, or shortness of breath, you should see a doctor as soon as possible. Trouble swallowing or excessive pain or bleeding from gums: If you have a history of a weakened immune system, diabetes, or steroid use, you may be more susceptible to infections. Infections can often be more severe and extensive or caused by unusual organisms. Dental and gum infections in people with these conditions may require more aggressive treatment. An abscess may need draining or IV antibiotics, for example.  MAKE SURE YOU   Understand these instructions. Will watch your condition. Will get help right away if you are not doing  well or get worse.  Thank you for choosing an e-visit.  Your e-visit answers were reviewed by a board certified advanced clinical practitioner to complete your personal care plan. Depending upon the condition, your plan could have included both over the counter or prescription medications.  Please review your pharmacy choice. Make sure the pharmacy is open so you can pick up prescription now. If there is a problem, you may contact your provider through [employer] and have the prescription routed to another pharmacy.  Your safety  is important to us . If you have drug allergies check your prescription carefully.   For the next 24 hours you can use MyChart to ask questions about today's visit, request a non-urgent call back, or ask for a work or school excuse. You will get an email in the next two days asking about your experience. I hope that your e-visit has been valuable and will speed your recovery.

## 2023-09-30 NOTE — Progress Notes (Signed)
 I have spent 5 minutes in review of e-visit questionnaire, review and updating patient chart, medical decision making and response to patient.   Claiborne Rigg, NP

## 2024-01-02 ENCOUNTER — Telehealth: Admitting: Family Medicine

## 2024-01-02 DIAGNOSIS — L02411 Cutaneous abscess of right axilla: Secondary | ICD-10-CM

## 2024-01-02 MED ORDER — CEPHALEXIN 500 MG PO CAPS: 500.0000 mg | ORAL_CAPSULE | Freq: Three times a day (TID) | ORAL | 0 refills | Status: AC

## 2024-01-02 MED ORDER — SULFAMETHOXAZOLE-TRIMETHOPRIM 800-160 MG PO TABS: 1.0000 | ORAL_TABLET | Freq: Two times a day (BID) | ORAL | 0 refills | Status: AC

## 2024-01-02 NOTE — Progress Notes (Signed)
 E Visit for Rash  We are sorry that you are not feeling well. Here is how we plan to help!  This is an abscess. I will send 2 antibiotics however If symptoms persist or worsen you will need to go to the urgent care. Please use warm compresses 15 minutes 5-6 times a day and keep area clean and dry.    HOME CARE:  Take cool showers and avoid direct sunlight. Apply cool compress or wet dressings. Take a bath in an oatmeal bath.  Sprinkle content of one Aveeno packet under running faucet with comfortably warm water.  Bathe for 15-20 minutes, 1-2 times daily.  Pat dry with a towel. Do not rub the rash. Use hydrocortisone cream. Take an antihistamine like Benadryl for widespread rashes that itch.  The adult dose of Benadryl is 25-50 mg by mouth 4 times daily. Caution:  This type of medication may cause sleepiness.  Do not drink alcohol, drive, or operate dangerous machinery while taking antihistamines.  Do not take these medications if you have prostate enlargement.  Read package instructions thoroughly on all medications that you take.  GET HELP RIGHT AWAY IF:  Symptoms don't go away after treatment. Severe itching that persists. If you rash spreads or swells. If you rash begins to smell. If it blisters and opens or develops a yellow-brown crust. You develop a fever. You have a sore throat. You become short of breath.  MAKE SURE YOU:  Understand these instructions. Will watch your condition. Will get help right away if you are not doing well or get worse.  Thank you for choosing an e-visit. Your e-visit answers were reviewed by a board certified advanced clinical practitioner to complete your personal care plan. Depending upon the condition, your plan could have included both over the counter or prescription medications. Please review your pharmacy choice. Be sure that the pharmacy you have chosen is open so that you can pick up your prescription now.  If there is a problem you may  message your provider in MyChart to have the prescription routed to another pharmacy. Your safety is important to us . If you have drug allergies check your prescription carefully.  For the next 24 hours, you can use MyChart to ask questions about todays visit, request a non-urgent call back, or ask for a work or school excuse from your e-visit provider. You will get an email in the next two days asking about your experience. I hope that your e-visit has been valuable and will speed your recovery.  I have spent 5 minutes in review of e-visit questionnaire, review and updating patient chart, medical decision making and response to patient.   Alezander Dimaano, FNP

## 2024-02-14 ENCOUNTER — Telehealth: Admitting: Physician Assistant

## 2024-02-14 DIAGNOSIS — J029 Acute pharyngitis, unspecified: Secondary | ICD-10-CM

## 2024-02-14 NOTE — Progress Notes (Signed)
 We are sorry that you are not feeling well.  Here is how we plan to help!  Your symptoms indicate a likely viral infection (Pharyngitis).   Pharyngitis is inflammation in the back of the throat which can cause a sore throat, scratchiness and sometimes difficulty swallowing.   Pharyngitis is typically caused by a respiratory virus and will just run its course.  Please keep in mind that your symptoms could last up to 10 days.    For throat pain, we recommend over the counter oral pain relief medications such as acetaminophen  or aspirin, or anti-inflammatory medications such as ibuprofen  or naproxen  sodium.  Topical treatments such as oral throat lozenges or sprays may be used as needed.   Avoid close contact with loved ones, especially the very young and elderly.  Remember to wash your hands thoroughly throughout the day as this is the number one way to prevent the spread of infection! We also recommend that you periodically wipe down door knobs and counters with disinfectant.  After careful review of your answers, I would not recommend and antibiotic for your condition.  Antibiotics should not be used to treat conditions that we suspect are caused by viruses like the virus that causes the common cold or flu.  Providers prescribe antibiotics to treat infections caused by bacteria. Antibiotics are very powerful in treating bacterial infections when they are used properly.  To maintain their effectiveness, they should be used only when necessary.  Overuse of antibiotics has resulted in the development of super bugs that are resistant to treatment!    Some people with strep throat, however, can have atypical symptoms. As such, if anything continued to progress despite treatment recommendations, you may need formal testing in clinic or office.  I have sent a work note to Pharmacologist. You can find by going to the Menu on your homepage, scrolling down to the Communications section, and selecting Letters. Let us   know if you have any issue locating. Take care and feel better soon!   Home Care: Only take medications as instructed by your medical team. Do not drink alcohol while taking these medications. A steam or ultrasonic humidifier can help congestion.  You can place a towel over your head and breathe in the steam from hot water coming from a faucet. Avoid close contacts especially the very young and the elderly. Cover your mouth when you cough or sneeze. Always remember to wash your hands.  Get Help Right Away If: You develop worsening fever or throat pain. You develop a severe head ache or visual changes. Your symptoms persist after you have completed your treatment plan.  Make sure you Understand these instructions. Will watch your condition. Will get help right away if you are not doing well or get worse.  Your e-visit answers were reviewed by a board certified advanced clinical practitioner to complete your personal care plan.  Depending on the condition, your plan could have included both over the counter or prescription medications.  If there is a problem please reply once you have received a response from your provider.  Your safety is important to us .  If you have drug allergies check your prescription carefully.    You can use MyChart to ask questions about todays visit, request a non-urgent call back, or ask for a work or school excuse for 24 hours related to this e-Visit. If it has been greater than 24 hours you will need to follow up with your provider, or enter a new  e-Visit to address those concerns.  You will get an e-mail in the next two days asking about your experience.  I hope that your e-visit has been valuable and will speed your recovery. Thank you for using e-visits.   I have spent 5 minutes in review of e-visit questionnaire, review and updating patient chart, medical decision making and response to patient.   Elsie Velma Lunger, PA-C
